# Patient Record
Sex: Male | Born: 1960
Health system: Southern US, Community
[De-identification: ages and names within clinical notes are randomized; demographics above are authoritative.]

## PROBLEM LIST (undated history)

## (undated) DIAGNOSIS — I4819 Other persistent atrial fibrillation: Secondary | ICD-10-CM

## (undated) DIAGNOSIS — I499 Cardiac arrhythmia, unspecified: Secondary | ICD-10-CM

## (undated) DIAGNOSIS — M1990 Unspecified osteoarthritis, unspecified site: Secondary | ICD-10-CM

## (undated) DIAGNOSIS — I10 Essential (primary) hypertension: Secondary | ICD-10-CM

## (undated) HISTORY — DX: Unspecified osteoarthritis, unspecified site: M19.90

## (undated) HISTORY — DX: Other persistent atrial fibrillation: I48.19

## (undated) HISTORY — PX: JOINT REPLACEMENT: SHX530

## (undated) HISTORY — PX: OTHER SURGICAL HISTORY: SHX169

## (undated) HISTORY — PX: CARDIOVERSION: SHX1299

## (undated) HISTORY — PX: APPENDECTOMY: SHX54

---

## 2009-07-07 ENCOUNTER — Encounter: Admission: RE | Admit: 2009-07-07 | Discharge: 2009-07-07 | Payer: Self-pay | Admitting: Neurosurgery

## 2009-07-22 ENCOUNTER — Encounter: Admission: RE | Admit: 2009-07-22 | Discharge: 2009-07-22 | Payer: Self-pay | Admitting: Neurosurgery

## 2009-08-09 ENCOUNTER — Encounter: Admission: RE | Admit: 2009-08-09 | Discharge: 2009-08-09 | Payer: Self-pay | Admitting: Neurosurgery

## 2009-10-08 ENCOUNTER — Encounter: Admission: RE | Admit: 2009-10-08 | Discharge: 2009-10-08 | Payer: Self-pay | Admitting: Neurosurgery

## 2009-12-10 ENCOUNTER — Encounter: Admission: RE | Admit: 2009-12-10 | Discharge: 2009-12-10 | Payer: Self-pay | Admitting: Neurosurgery

## 2010-04-14 ENCOUNTER — Other Ambulatory Visit: Payer: Self-pay | Admitting: Orthopedic Surgery

## 2010-04-14 DIAGNOSIS — M25551 Pain in right hip: Secondary | ICD-10-CM

## 2010-04-15 ENCOUNTER — Ambulatory Visit
Admission: RE | Admit: 2010-04-15 | Discharge: 2010-04-15 | Disposition: A | Discharge status: Home / Self Care | Payer: PRIVATE HEALTH INSURANCE | Source: Ambulatory Visit | Attending: Orthopedic Surgery | Admitting: Orthopedic Surgery

## 2010-04-15 DIAGNOSIS — M25551 Pain in right hip: Secondary | ICD-10-CM

## 2010-05-25 ENCOUNTER — Encounter (HOSPITAL_COMMUNITY): Payer: PRIVATE HEALTH INSURANCE | Attending: Orthopedic Surgery

## 2010-05-25 ENCOUNTER — Other Ambulatory Visit (HOSPITAL_COMMUNITY): Payer: Self-pay

## 2010-05-25 DIAGNOSIS — Z01812 Encounter for preprocedural laboratory examination: Secondary | ICD-10-CM | POA: Insufficient documentation

## 2010-05-25 DIAGNOSIS — Z01810 Encounter for preprocedural cardiovascular examination: Secondary | ICD-10-CM | POA: Insufficient documentation

## 2010-05-25 LAB — DIFFERENTIAL
Basophils Absolute: 0.1 10*3/uL (ref 0.0–0.1)
Basophils Relative: 1 % (ref 0–1)
Eosinophils Absolute: 0.1 10*3/uL (ref 0.0–0.7)
Monocytes Absolute: 0.8 10*3/uL (ref 0.1–1.0)
Monocytes Relative: 8 % (ref 3–12)

## 2010-05-25 LAB — BASIC METABOLIC PANEL
BUN: 22 mg/dL (ref 6–23)
Calcium: 9 mg/dL (ref 8.4–10.5)
Chloride: 103 mEq/L (ref 96–112)
Creatinine, Ser: 0.81 mg/dL (ref 0.4–1.5)

## 2010-05-25 LAB — URINALYSIS, ROUTINE W REFLEX MICROSCOPIC
Ketones, ur: NEGATIVE mg/dL
Nitrite: NEGATIVE
Protein, ur: NEGATIVE mg/dL
Urobilinogen, UA: 1 mg/dL (ref 0.0–1.0)

## 2010-05-25 LAB — CBC
MCH: 31.4 pg (ref 26.0–34.0)
MCHC: 33.5 g/dL (ref 30.0–36.0)
Platelets: 290 10*3/uL (ref 150–400)
RDW: 13.5 % (ref 11.5–15.5)

## 2010-05-25 LAB — APTT: aPTT: 28 seconds (ref 24–37)

## 2010-05-25 LAB — SURGICAL PCR SCREEN: MRSA, PCR: NEGATIVE

## 2010-05-26 ENCOUNTER — Ambulatory Visit (INDEPENDENT_AMBULATORY_CARE_PROVIDER_SITE_OTHER): Payer: PRIVATE HEALTH INSURANCE | Admitting: Cardiology

## 2010-05-26 ENCOUNTER — Encounter: Payer: Self-pay | Admitting: Cardiology

## 2010-05-26 DIAGNOSIS — I4891 Unspecified atrial fibrillation: Secondary | ICD-10-CM

## 2010-05-26 DIAGNOSIS — Z01810 Encounter for preprocedural cardiovascular examination: Secondary | ICD-10-CM

## 2010-05-26 MED ORDER — METOPROLOL SUCCINATE ER 25 MG PO TB24: 25.0000 mg | ORAL_TABLET | Freq: Every day | ORAL | Status: DC

## 2010-05-26 NOTE — Progress Notes (Addendum)
HPI: 50 year old male with no prior cardiac history for evaluation of atrial fibrillation and preoperatively prior to hip replacement. Patient seen yesterday for evaluation prior to hip replacement. Routine electrocardiogram revealed atrial fibrillation. Patient denies dyspnea on exertion, orthopnea, PND, pedal edema, palpitations, syncope or chest pain. Because of his atrial fibrillation we were asked to evaluate. No recent potassium, renal function and hemoglobin normal.  Current Outpatient Prescriptions  Medication Sig Dispense Refill  . naproxen sodium (ANAPROX) 220 MG tablet Take 220 mg by mouth as needed.        . metoprolol succinate (TOPROL XL) 25 MG 24 hr tablet Take 1 tablet (25 mg total) by mouth daily.  30 tablet  11    No Known Allergies  No past medical history on file.  Past Surgical History  Procedure Date  . Left shoulder surgery   . Appendectomy     History   Social History  . Marital Status: Single    Spouse Name: N/A    Number of Children: 2  . Years of Education: N/A   Occupational History  .     Social History Main Topics  . Smoking status: Never Smoker   . Smokeless tobacco: Not on file  . Alcohol Use: Yes     occasional  . Drug Use: No  . Sexually Active: Not on file   Other Topics Concern  . Not on file   Social History Narrative  . No narrative on file    No family history on file.  ROS: no fevers or chills, productive cough, hemoptysis, dysphasia, odynophagia, melena, hematochezia, dysuria, hematuria, rash, seizure activity, orthopnea, PND, pedal edema, claudication. Remaining systems are negative.  Physical Exam: General:  Well developed/well nourished in NAD Skin warm/dry Patient not depressed No peripheral clubbing Back-normal HEENT-normal/normal eyelids Neck supple/normal carotid upstroke bilaterally; no bruits; no JVD; no thyromegaly chest - CTA/ normal expansion CV - RRR/normal S1 and S2; no murmurs, rubs or gallops;  PMI  nondisplaced Abdomen -NT/ND, no HSM, no mass, + bowel sounds, no bruit 2+ femoral pulses, no bruits Ext-no edema, chords, 2+ DP Neuro-grossly nonfocal  ECG 05/25/10 - Atrial fibrillation at a rate of 104. Axis normal. No ST changes.   Electrocardiogram today shows normal sinus rhythm with no ST changes.

## 2010-05-26 NOTE — Patient Instructions (Signed)
Your physician wants you to follow-up in: 3 MONTHS You will receive a reminder letter in the mail two months in advance. If you don't receive a letter, please call our office to schedule the follow-up appointment.   Your physician has requested that you have a lexiscan myoview. For further information please visit [email or web address] Please follow instruction sheet, as given.   Your physician has requested that you have an echocardiogram. Echocardiography is a painless test that uses sound waves to create images of your heart. It provides your doctor with information about the size and shape of your heart and how well your heart's chambers and valves are working. This procedure takes approximately one hour. There are no restrictions for this procedure.   Your physician recommends that you return for lab work in: TODAY  START METOPROLOL SUCC 25 MG ONCE DAILY  START ASPIRIN 325 MG ONCE DAILY AFTER SURGERY

## 2010-05-26 NOTE — Assessment & Plan Note (Signed)
Given atrial fibrillation, will screen for ischemia. Schedule lexiscan myoview.

## 2010-05-26 NOTE — Assessment & Plan Note (Signed)
Patient has new diagnosis of atrial fibrillation. He is not having symptoms and has converted to sinus rhythm on electrocardiogram today. He has no embolic risk factors. Plan add aspirin 325 mg daily following his hip replacement. Check TSH and echocardiogram. Add Toprol 25 mg daily.

## 2010-05-30 NOTE — H&P (Signed)
NAME:  Lance Hunt, Lance Hunt                ACCOUNT NO.:  [id number]  MEDICAL RECORD NO.:  [id number]           PATIENT TYPE:  O  LOCATION:  PADM                         FACILITY:  [facility]  PHYSICIAN:  Madlyn Frankel. Charlann Boxer, M.D.  DATE OF BIRTH:  [birth year]  DATE OF ADMISSION:  05/25/2010 DATE OF DISCHARGE:                             HISTORY & PHYSICAL   DATE OF SURGERY:  May 31, 2010  ADMISSION DIAGNOSIS:  Right hip osteoarthritis.  HISTORY OF PRESENT ILLNESS:  This is a 50 year old gentleman with a history of osteoarthritis of his right hip that has failed conservative treatment to alleviate his symptoms.  After discussion of treatments, benefits, risks, and options, the patient is now scheduled for total hip arthroplasty.  Note that at this time, he is not a candidate for tranexamic acid.  He is given his home medications including aspirin 325 mg b.i.d. for 4 weeks for DVT prophylaxis.  Note that on his H and P today, he did have an irregular heartbeat that he has not then noted to have before.  We will be getting an EKG at his preop today at the hospital and getting him a medical clearance prior to surgery with this. Also note, his blood pressure was mildly elevated at 158/100 today.  PAST MEDICAL HISTORY:  Drug allergies none.  CURRENT MEDICATIONS:  None.  PREVIOUS SURGERY:  Appendectomy and left shoulder surgery.  FAMILY HISTORY:  Positive for diabetes.  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  He drinks socially and does not smoke.  REVIEW OF SYSTEMS:  CENTRAL NERVOUS SYSTEM:  Negative for headache, blurred vision, or dizziness.  PULMONARY: Negative for shortness breath, PND, and orthopnea.  CARDIOVASCULAR:  Negative for chest pain or palpitation.  GI:  Negative for ulcers or hepatitis.  GU:  Negative for urinary tract difficulty.  MUSCULOSKELETAL:  Positive as in HPI.  PHYSICAL EXAMINATION:  VITAL SIGNS:  BP 158/100, respirations 16,  pulse 84 and irregular. GENERAL APPEARANCE:  This is a well-developed, well-nourished gentleman in no acute distress. HEENT:  Head normocephalic.  Nose patent.  Pupils equal, round, and reactive to light.  Throat without injection. NECK:  Supple without adenopathy.  Carotids 2+ without bruit. CHEST:  Clear to auscultation.  No rales or rhonchi.  Respirations 16. HEART:  Irregular rate and rhythm at 84 beats per minute without murmur. ABDOMEN:  Soft with active bowel sounds.  No masses or organomegaly. NEUROLOGIC:  The patient alert and oriented to time, place, and person. Cranial nerves II through XII grossly intact. EXTREMITIES:  The right hip with decreased range of motion with pain. Neurovascular status intact.  IMPRESSION:  Right hip osteoarthritis.  PLAN:  Total hip arthroplasty, right hip.  We will get medical evaluation of his heart and medical clearance preop.  Pending that surgery, we will go ahead and schedule.     Jaquelyn Bitter. Chabon, P.A.   ______________________________ Madlyn Frankel Charlann Boxer, M.D.    SJC/MEDQ  D:  05/25/2010  T:  05/26/2010  Job:  [id number]  Electronically Signed by Jodene Nam P.A. on 05/30/2010 12:40:28 PM  Electronically Signed by Durene Romans M.D. on 05/30/2010 01:02:05 PM

## 2010-05-31 ENCOUNTER — Ambulatory Visit (HOSPITAL_BASED_OUTPATIENT_CLINIC_OR_DEPARTMENT_OTHER): Payer: PRIVATE HEALTH INSURANCE | Admitting: Radiology

## 2010-05-31 ENCOUNTER — Other Ambulatory Visit (HOSPITAL_COMMUNITY): Payer: PRIVATE HEALTH INSURANCE | Admitting: Radiology

## 2010-05-31 ENCOUNTER — Ambulatory Visit (HOSPITAL_COMMUNITY): Payer: PRIVATE HEALTH INSURANCE | Attending: Cardiology | Admitting: Radiology

## 2010-05-31 ENCOUNTER — Ambulatory Visit (HOSPITAL_COMMUNITY)
Admission: RE | Admit: 2010-05-31 | Disposition: A | Discharge status: Home / Self Care | Payer: PRIVATE HEALTH INSURANCE | Source: Ambulatory Visit | Attending: Orthopedic Surgery | Admitting: Orthopedic Surgery

## 2010-05-31 ENCOUNTER — Inpatient Hospital Stay (HOSPITAL_COMMUNITY): Admit: 2010-05-31 | Payer: Self-pay | Admitting: Orthopedic Surgery

## 2010-05-31 DIAGNOSIS — M1610 Unilateral primary osteoarthritis, unspecified hip: Secondary | ICD-10-CM | POA: Insufficient documentation

## 2010-05-31 DIAGNOSIS — R0602 Shortness of breath: Secondary | ICD-10-CM

## 2010-05-31 DIAGNOSIS — Z01810 Encounter for preprocedural cardiovascular examination: Secondary | ICD-10-CM

## 2010-05-31 DIAGNOSIS — I059 Rheumatic mitral valve disease, unspecified: Secondary | ICD-10-CM | POA: Insufficient documentation

## 2010-05-31 DIAGNOSIS — I4949 Other premature depolarization: Secondary | ICD-10-CM

## 2010-05-31 DIAGNOSIS — M169 Osteoarthritis of hip, unspecified: Secondary | ICD-10-CM | POA: Insufficient documentation

## 2010-05-31 DIAGNOSIS — I4891 Unspecified atrial fibrillation: Secondary | ICD-10-CM

## 2010-05-31 MED ORDER — TECHNETIUM TC 99M TETROFOSMIN IV KIT
11.0000 | PACK | Freq: Once | INTRAVENOUS | Status: AC | PRN
  Administered 2010-05-31: 11 via INTRAVENOUS

## 2010-05-31 MED ORDER — REGADENOSON 0.4 MG/5ML IV SOLN
0.4000 mg | Freq: Once | INTRAVENOUS | Status: AC
  Administered 2010-05-31: 0.4 mg via INTRAVENOUS

## 2010-05-31 MED ORDER — TECHNETIUM TC 99M TETROFOSMIN IV KIT
33.0000 | PACK | Freq: Once | INTRAVENOUS | Status: AC | PRN
  Administered 2010-05-31: 33 via INTRAVENOUS

## 2010-05-31 NOTE — Progress Notes (Signed)
[facility] SITE 3 NUCLEAR MED [address] [place] [address] [phone]  Cardiology Nuclear Med Study  Lance Hunt is a 50 y.o. male [id number] [birth year]   Nuclear Med Background Indication for Stress Test:  Evaluation for Ischemia and Surgical Clearance for (R) THR by Dr.M.Olin on 07/26/10 History:  New diagnosis of Atrial fib Cardiac Risk Factors: None  Symptoms:  None   Nuclear Pre-Procedure Caffeine/Decaff Intake:  None NPO After: 7:30pm   Lungs:  clear IV 0.9% NS with Angio Cath:  22g  IV Site: R Hand  IV Started by:  Irean Hong, RN  Chest Size (in):  42 Cup Size: n/a  Height: 6' (1.829 m)  Weight:  200 lb (90.719 kg)  BMI:  Body mass index is 27.12 kg/(m^2). Tech Comments:  Last dose toprol 24 hrs ago    Nuclear Med Study 1 or 2 day study: 1 day  Stress Test Type:  Treadmill/Lexiscan  Reading MD: Charlton Haws, MD  Order Authorizing Provider:  Ripley Fraise  Resting Radionuclide: Technetium 78m Tetrofosmin  Resting Radionuclide Dose: 11 mCi   Stress Radionuclide:  Technetium 42m Tetrofosmin  Stress Radionuclide Dose: 33 mCi           Stress Protocol Rest HR: 64 Stress HR: 127  Rest BP: 125/85 Stress BP: 168/97  Exercise Time (min): 2:00 METS: 1.8   Predicted Max HR: 171 bpm % Max HR: 73.68 bpm Rate Pressure Product: 21308   Dose of Adenosine (mg):  n/a Dose of Lexiscan: 0.4 mg  Dose of Atropine (mg): n/a Dose of Dobutamine: n/a mcg/kg/min (at max HR)  Stress Test Technologist: Cathlyn Parsons, RN  Nuclear Technologist:  Domenic Polite, CNMT     Rest Procedure:  Myocardial perfusion imaging was performed at rest 45 minutes following the intravenous administration of Technetium 11m Tetrofosmin. Rest ECG: NSR with PAC's  Stress Procedure:  The patient received IV Lexiscan 0.4 mg over 15-seconds with concurrent low level exercise and then Technetium 74m Tetrofosmin was injected at 30-seconds while the patient  continued walking one more minute.  There were no significant changes with Lexiscan. Patient had occasional PAC's and rare PVC's. Quantitative spect images were obtained after a 45-minute delay. Stress ECG: No significant change from baseline ECG  QPS Raw Data Images:  Normal; no motion artifact; normal heart/lung ratio. Stress Images:  Normal homogeneous uptake in all areas of the myocardium. Rest Images:  Normal homogeneous uptake in all areas of the myocardium. Subtraction (SDS):  SDS 1 Transient Ischemic Dilatation (Normal <1.22):  1.07 Lung/Heart Ratio (Normal <0.45):  0.43  Quantitative Gated Spect Images QGS EDV:  n/a  QGS ESV:  n/a QGS cine images:  study not gated QGS EF: Study not gated  Impression Exercise Capacity:  Lexiscan with no exercise. BP Response:  Normal blood pressure response. Clinical Symptoms:  There is dyspnea. ECG Impression:  Frequent PAC;s Comparison with Prior Nuclear Study: No images to compare  Overall Impression:  Normal stress nuclear study.  No gating due to frequent PAC;s       Charlton Haws

## 2010-06-01 ENCOUNTER — Encounter: Payer: Self-pay | Admitting: Cardiology

## 2010-06-01 NOTE — Progress Notes (Signed)
Copy routed to Dr. Crenshaw.Falecha Clark ° °

## 2010-06-01 NOTE — Progress Notes (Signed)
Per Dr. Jens Som, stress test normal. He is cleared for his surgery.

## 2010-07-08 ENCOUNTER — Telehealth: Payer: Self-pay | Admitting: Cardiology

## 2010-07-08 NOTE — Telephone Encounter (Signed)
Faxed LOV, EKG, Stress & Echo. To [facility] @ [place] ([phone]).

## 2010-07-10 HISTORY — PX: TOTAL HIP ARTHROPLASTY: SHX124

## 2010-07-20 ENCOUNTER — Other Ambulatory Visit: Payer: Self-pay | Admitting: Orthopedic Surgery

## 2010-07-20 ENCOUNTER — Other Ambulatory Visit (HOSPITAL_COMMUNITY): Payer: Self-pay | Admitting: Orthopedic Surgery

## 2010-07-20 ENCOUNTER — Ambulatory Visit (HOSPITAL_COMMUNITY)
Admission: RE | Admit: 2010-07-20 | Discharge: 2010-07-20 | Disposition: A | Discharge status: Home / Self Care | Payer: PRIVATE HEALTH INSURANCE | Source: Ambulatory Visit | Attending: Orthopedic Surgery | Admitting: Orthopedic Surgery

## 2010-07-20 ENCOUNTER — Encounter (HOSPITAL_COMMUNITY): Payer: PRIVATE HEALTH INSURANCE

## 2010-07-20 DIAGNOSIS — Z01811 Encounter for preprocedural respiratory examination: Secondary | ICD-10-CM

## 2010-07-20 DIAGNOSIS — I10 Essential (primary) hypertension: Secondary | ICD-10-CM | POA: Insufficient documentation

## 2010-07-20 DIAGNOSIS — Z01810 Encounter for preprocedural cardiovascular examination: Secondary | ICD-10-CM | POA: Insufficient documentation

## 2010-07-20 DIAGNOSIS — M4120 Other idiopathic scoliosis, site unspecified: Secondary | ICD-10-CM | POA: Insufficient documentation

## 2010-07-20 DIAGNOSIS — Z01812 Encounter for preprocedural laboratory examination: Secondary | ICD-10-CM | POA: Insufficient documentation

## 2010-07-20 DIAGNOSIS — Z01818 Encounter for other preprocedural examination: Secondary | ICD-10-CM | POA: Insufficient documentation

## 2010-07-20 LAB — CBC
MCH: 32.6 pg (ref 26.0–34.0)
MCHC: 33.5 g/dL (ref 30.0–36.0)
Platelets: 272 10*3/uL (ref 150–400)
RDW: 13.5 % (ref 11.5–15.5)

## 2010-07-20 LAB — BASIC METABOLIC PANEL
BUN: 26 mg/dL — ABNORMAL HIGH (ref 6–23)
CO2: 26 mEq/L (ref 19–32)
Chloride: 102 mEq/L (ref 96–112)
GFR calc Af Amer: >60 mL/min (ref >60)
Glucose, Bld: 99 mg/dL (ref 70–99)
Potassium: 4.2 mEq/L (ref 3.5–5.1)

## 2010-07-20 LAB — DIFFERENTIAL
Basophils Absolute: 0.1 10*3/uL (ref 0.0–0.1)
Basophils Relative: 1 % (ref 0–1)
Eosinophils Absolute: 0.2 10*3/uL (ref 0.0–0.7)
Monocytes Absolute: 1 10*3/uL (ref 0.1–1.0)
Monocytes Relative: 10 % (ref 3–12)
Neutro Abs: 6.1 10*3/uL (ref 1.7–7.7)

## 2010-07-20 LAB — URINALYSIS, ROUTINE W REFLEX MICROSCOPIC
Bilirubin Urine: NEGATIVE
Glucose, UA: NEGATIVE mg/dL
Ketones, ur: NEGATIVE mg/dL
Protein, ur: NEGATIVE mg/dL
Urobilinogen, UA: 1 mg/dL (ref 0.0–1.0)

## 2010-07-20 LAB — PROTIME-INR
INR: 0.93 (ref 0.00–1.49)
Prothrombin Time: 12.7 seconds (ref 11.6–15.2)

## 2010-07-20 LAB — URINE MICROSCOPIC-ADD ON

## 2010-07-20 NOTE — H&P (Unsigned)
NAME:  ESKER, DEVER NO.:  [id number]  MEDICAL RECORD NO.:  [id number]  LOCATION:  1S                           FACILITY:  [facility]  PHYSICIAN:  Madlyn Frankel. Charlann Boxer, M.D.  DATE OF BIRTH:  09-26-1960  DATE OF ADMISSION:  07/04/2010 DATE OF DISCHARGE:                             HISTORY & PHYSICAL   DATE OF SURGERY:  July 26, 2010.  CARDIOLOGIST:  Madolyn Frieze. Jens Som, MD, [facility]  The patient will be going home after surgery.  He is not a candidate for tranexamic acid.  He has been given his home medications of aspirin, Robaxin, iron, Colace, and MiraLax to take after surgery.  CHIEF COMPLAINT:  Right hip osteoarthritis.  HISTORY OF PRESENT ILLNESS:  This is a 50 year old gentleman with a history of osteoarthritis of his right hip that failed conservative treatment.  His last H and P was noted to have an irregular heartbeat, felt to be AFib.  He was sent to the hospital.  EKG did document new- onset AFib.  He went to the cardiologist the next day, he had already converted at that time, was worked up and found no contraindications to proceeding with the surgery, and at this time is to proceed with his right total hip arthroplasty by anterior approach.  The surgeries, benefits and aftercare were discussed in detail with the patient. Questions invited and answered.  Surgery to go ahead as scheduled.  PAST MEDICAL HISTORY:  Drug allergies, none.  CURRENT MEDICATIONS:  Metoprolol ER 25 mg once a day.  PAST SURGERIES:  Appendectomy and left shoulder surgery.  SERIOUS MEDICAL ILLNESSES: 1. History of AFib. 2,  Hypertension.  FAMILY HISTORY:  Positive for diabetes.  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  He drinks socially and does not smoke.  REVIEW OF SYSTEMS:  CENTRAL NERVOUS SYSTEM:  Negative for headache, blurred vision, or dizziness.  PULMONARY:  No shortness of breath, PND, or orthopnea.  CARDIOVASCULAR:  Negative for  chest pain or palpitation. Positive for history of AFib, currently in normal sinus rhythm.  GI: Negative for ulcers, hepatitis.  GU:  Negative for urinary tract difficulty.  MUSCULOSKELETAL:  Positive in HPI.  PHYSICAL EXAMINATION:  VITAL SIGNS:  BP 158/84, respirations 16, pulse 68 and regular. GENERAL APPEARANCE:  This is a well-developed, well-nourished gentleman in no acute distress. HEENT:  Head normocephalic.  Nose patent.  Ears patent.  Pupils equal, round and reactive to light.  Throat without injection. NECK:  Supple without adenopathy.  Carotids 2+ without bruit. CHEST:  Clear to auscultation.  No rales or rhonchi.  Respirations 16, pulse 68 and regular. HEART:  Regular rate and rhythm at 68 beats per minute without murmur. ABDOMEN:  Soft with active bowel sounds.  No masses or organomegaly. NEUROLOGIC:  The patient alert and oriented to time, place and person. Cranial nerves II through XII grossly intact. EXTREMITIES:  The right hip with about 5 degrees flexion contracture, further flexion at 90 with pain, 30 degrees arc of motion with pain. Neurovascular status intact.  IMPRESSION:  Right hip osteoarthritis.  PLAN:  Right total hip replacement by anterior approach.  Jaquelyn Bitter. Courtnee Myer, P.A.   ______________________________ Madlyn Frankel Charlann Boxer, M.D.    SJC/MEDQ  D:  07/20/2010  T:  07/20/2010  Job:  [id number]

## 2010-07-26 ENCOUNTER — Inpatient Hospital Stay (HOSPITAL_COMMUNITY)
Admission: RE | Admit: 2010-07-26 | Discharge: 2010-07-28 | DRG: 470 | Disposition: A | Discharge status: Home Health | Payer: PRIVATE HEALTH INSURANCE | Source: Ambulatory Visit | Attending: Orthopedic Surgery | Admitting: Orthopedic Surgery

## 2010-07-26 ENCOUNTER — Inpatient Hospital Stay (HOSPITAL_COMMUNITY): Payer: PRIVATE HEALTH INSURANCE

## 2010-07-26 DIAGNOSIS — M169 Osteoarthritis of hip, unspecified: Principal | ICD-10-CM | POA: Diagnosis present

## 2010-07-26 DIAGNOSIS — Z01812 Encounter for preprocedural laboratory examination: Secondary | ICD-10-CM

## 2010-07-26 DIAGNOSIS — M1610 Unilateral primary osteoarthritis, unspecified hip: Principal | ICD-10-CM | POA: Diagnosis present

## 2010-07-26 DIAGNOSIS — I4891 Unspecified atrial fibrillation: Secondary | ICD-10-CM

## 2010-07-26 LAB — TYPE AND SCREEN
ABO/RH(D): AB POS
Antibody Screen: NEGATIVE

## 2010-07-27 LAB — BASIC METABOLIC PANEL
Calcium: 8.6 mg/dL (ref 8.4–10.5)
Chloride: 105 mEq/L (ref 96–112)
Creatinine, Ser: 0.66 mg/dL (ref 0.50–1.35)
GFR calc Af Amer: >60 mL/min (ref >60)
Sodium: 137 mEq/L (ref 135–145)

## 2010-07-27 LAB — CBC
MCV: 96.7 fL (ref 78.0–100.0)
Platelets: 261 10*3/uL (ref 150–400)
RDW: 13.1 % (ref 11.5–15.5)
WBC: 8.7 10*3/uL (ref 4.0–10.5)

## 2010-07-28 LAB — CBC
Platelets: 260 10*3/uL (ref 150–400)
RDW: 13 % (ref 11.5–15.5)
WBC: 10.2 10*3/uL (ref 4.0–10.5)

## 2010-07-28 LAB — BASIC METABOLIC PANEL
Chloride: 103 mEq/L (ref 96–112)
GFR calc Af Amer: >60 mL/min (ref >60)
Potassium: 3.9 mEq/L (ref 3.5–5.1)

## 2010-08-01 NOTE — Op Note (Signed)
NAMEMarland Kitchen  Lance Hunt, Lance Hunt NO.:  [id number]  MEDICAL RECORD NO.:  [id number]  LOCATION:  1434                         FACILITY:  [facility]  PHYSICIAN:  Madlyn Frankel. Charlann Boxer, M.D.  DATE OF BIRTH:  07/26/1960  DATE OF PROCEDURE:  07/26/2010 DATE OF DISCHARGE:                              OPERATIVE REPORT   PREOPERATIVE DIAGNOSIS:  Right hip osteoarthritis.  POSTOPERATIVE DIAGNOSIS:  Right hip osteoarthritis.  FINDINGS:  The patient has severe right hip osteoarthritis with significant scarring to the anterior capsular tissues including the anterior musculature in this area.  PROCEDURE:  Right total hip replacement utilizing a DePuy component with a size 54 pinnacle cup, 36 +4 neutral AltrX liner and 8 high Tri-Lock stem with 36 +1.5 Delta ceramic ball.  SURGEON:  Madlyn Frankel. Charlann Boxer, MD  ASSISTANT:  Jaquelyn Bitter. Chabon, P.A.  ANESTHESIA:  General.  BLOOD LOSS:  Approximately 600 cc.  DRAINS:  One Hemovac.  COMPLICATIONS:  None.  INDICATIONS FOR PROCEDURE:  Lance Hunt is a 50 year old male who presented to the office for evaluation of right, greater than left hip pain.  Radiographs revealed end-stage right hip osteoarthritis with cystic changes with slightly less severe changes to the left hip.  After reviewing risks and benefits and obtaining medical and cardiac clearance, the surgery was scheduled for a right total hip replacement. We discussed the risks of infection, DVT, component failure, need for revision surgery, the pros and cons of an anterior versus posterior approach and consent was obtained for benefit of pain relief.  PROCEDURE IN DETAIL:  The patient was brought to operative theater. Once adequate anesthesia, preoperative antibiotics administered, the patient was positioned supine on the OSI Hanna table with bony prominence padded.  The right arm tucked across the body.  The right hip was then pre-prepped.  Radiographs under fluoroscopic imaging  were obtained confirming location of the pelvis and landmarks.  The right hip was then prepped and draped in sterile fashion.  Timeout was performed identifying the patient, planned procedure and the extremity.  An incision was made from 2 cm inferior and lateral to the anterior- superior iliac spine over the anterior aspect of the trochanter.  Sharp dissection was carried down to the fascia of the tensor fascia lata muscle.  Muscle plane was created and muscle swept laterally and retractor placed along the superior neck.  The circumflex vessels were then identified and cauterized.  Pericapsular fat removed and retractor placed inferiorly.  Following further exposure of hip, we identified significant anterior scarring related to the arthritis.  I feel the capsule was very adherent to the anterior capsule as was the musculature anteriorly.  A capsulotomy was made but I ended up removing the inferior leaflets based on the adherence of the capsule down to the head and neck.  Fluoroscopy was used to identify location for a neck cut.  The neck osteotomy was then made and femoral head removed and noted severe arthritis of his right hip.  Traction was taken off the femur and attention was now directed to acetabulum.  Acetabular retractors were positioned.  Labrum and soft tissues removed.  I began reaming with a 44 reamer and reamed up  to 53 reamer with good bony bed preparation.  I removed posterior osteophyte, anterior osteophyte.  The 54 pinnacle cup was then impacted and positioned with about 35 degrees of abduction radiographically and then 15 to 20 degrees of anteversion.  This was confirmed radiographically. Based on the position of the cup and intraoperative by palpation and radiographically, a single cancellous screw was placed.  A 36 +4 neutral final AltrX liner was impacted.  At this point, the femur was rotated to 100 degrees.  The capsule was removed off the inferior neck.   Retractor placed inferiorly along the medial calcar and the femur elevated with a hook and held in place with a table hook.  The hip was then extended and abducted and proximal femur opened the broach.  With the starting, a box osteotome, I than began using a starting broach and broached up to a size initially to 1.  I confirmed the location of the stem in AP and lateral planes with radiographs without retractors. The retractors were placed and I ended up broaching up to a size 8.  A trial reduction was carried out with initially an 8 standard but there also did not appear to be appropriate fairly contralateral side.  With the 8 high offset neck and 36 plus 1.5 ball, I did feel that I was lengthening a bit but I was okay with this based on the stability of the hip but also based on the fact that he had contralateral hip arthritis and we could match this within a year or so.  Given these findings, a final 8 high Tri-Lock stem was chosen and it was impacted and sat where the previous broach had been placed.  The 36 +1.5 Delta ceramic ball was chosen based on his age to use as a ceramic on polyethylene insert.  The hip had been irrigated throughout the case again at this point. There was no capsular repair but I did restore the superior leaflet back to its original location.  A medium Hemovac drain was placed deep.  The fascia of the tensor fascia muscle was then reapproximated over top of this.  The remainder of the wound was closed with 2-0 Vicryl and running 4-0 Monocryl.  The hip was cleaned, dried, and dressed sterilely using Dermabond and Aquacel dressing.  Drain site dressed separately.  He was then brought to recovery room in stable condition, extubated, tolerating the procedure well.     Madlyn Frankel Charlann Boxer, M.D.     MDO/MEDQ  D:  07/26/2010  T:  07/26/2010  Job:  [id number]  Electronically Signed by Durene Romans M.D. on 08/01/2010 10:14:23 AM

## 2010-08-01 NOTE — Discharge Summary (Signed)
NAMEMarland Kitchen  Lance, Hunt NO.:  [id number]  MEDICAL RECORD NO.:  [id number]  LOCATION:  1434                         FACILITY:  [facility]  PHYSICIAN:  Madlyn Frankel. Charlann Boxer, M.D.  DATE OF BIRTH:  May 09, 1960  DATE OF ADMISSION:  07/26/2010 DATE OF DISCHARGE:  07/28/2010                              DISCHARGE SUMMARY   ADMITTING DIAGNOSIS:  Right hip osteoarthritis.  DISCHARGE DIAGNOSES: 1. Right hip osteoarthritis. 2. History of new-onset atrial fibrillation.  ADMITTING HISTORY:  The patient is a 50 year old male with advanced bilateral hip osteoarthritis, right greater left.  He presented for evaluation, was actually scheduled for surgery back in May of 2012.  At the time of that surgery, he had new-onset atrial fibrillation and anesthesia cancelled the surgery.  After being seen and evaluated by Dr. [name] with [facility] he was set up for surgery again this July.  Risks and benefits have been reviewed and discussed. Consent was obtained for benefit of pain relief.  HOSPITAL COURSE:  The patient admitted for same-day surgery on July 16, 2010.  At that time, he underwent a right total hip replacement.  Please see dictated operative note for full details of the procedure.  At the time of induction, they notice he was back into atrial fibrillation.  We subsequently reconsulted Cardiology for evaluation.  They saw him on that day of surgery and felt that at the point of their evaluation he was in normal sinus rhythm with some PACs.  On postop day #1, he was stable with no events overnight.  His Foley catheter and Hemovac drain were removed on day #1.  On postop day #1, he had hematocrit of 35.2 and on day #2 his hematocrit was 38.  His thigh remained relatively soft without significant bruising.  He was neurovascular intact.  On day #2, he was seen and evaluated in the hospital chair with hip flexed obviously indicating good pain relief in certain  indifferent postures,  He was seen and evaluated by Physical Therapy and progressed nicely using a walker with weightbearing as tolerated.  DISCHARGE CONDITION:  Stable  DISCHARGE INSTRUCTIONS:  The patient can be discharged home with Home Health Physical Therapy, transitioning from a walker to cane.  He will return to see Dr. Durene Romans at [facility] at [phone] in 2 weeks' time and any orthopedic questions can be addressed there.  Any concerns about heart rate should be addressed by Dr. [name].  He does know to arrange followup with them in the next 2 to 3 weeks to make certain he is not going in atrial fibrillation or any other issues there.  He can continue to wear his Aquacel dressing for 8 days.  He will remove this and then place gauze and tape.  DISCHARGE MEDICATIONS:  Include: 1. Colace 100 mg p.o. b.i.d. as needed for constipation while on pain     medicine. 2. MiraLax 17 g p.o. once or twice a day as needed for constipation     while on pain medicine. 3. Aspirin 325 mg p.o. b.i.d. for 30 days. 4. Norco 7.5/325 one to two tablets every 4 to 6 as needed for pain. 5.  Robaxin 500 mg p.o. q6 h as needed for pain. 6. Metoprolol 25 mg p.o. q.a.m. 7. Advil or Aleve as needed.  Questions encouraged and reviewed at the time of discharge.     Madlyn Frankel Charlann Boxer, M.D.     MDO/MEDQ  D:  07/28/2010  T:  07/28/2010  Job:  [id number]  Electronically Signed by Durene Romans M.D. on 08/01/2010 10:14:27 AM

## 2010-08-05 NOTE — Consult Note (Signed)
NAMEMarland Hunt  CRIT, OBREMSKI NO.:  [id number]  MEDICAL RECORD NO.:  [id number]  LOCATION:  1434                         FACILITY:  [facility]  PHYSICIAN:  Rollene Rotunda, MD, FACCDATE OF BIRTH:  Mar 03, 1960  DATE OF CONSULTATION:  07/26/2010 DATE OF DISCHARGE:                                CONSULTATION   PRIMARY CARE PHYSICIAN:  None.  CARDIOLOGIST:  Madolyn Frieze. Jens Som, MD, [facility].  REFERRING PHYSICIAN:  Madlyn Frankel. Charlann Boxer, M.D.  REASON FOR CONSULTATION:  The patient with atrial fibrillation.  HISTORY OF PRESENT ILLNESS:  The patient is pleasant 50 year old white gentleman who had atrial fibrillation noted previously when orthopedic surgery was attempted.  Apparently this procedure was cancelled.  He was sent to see Dr. Jens Som.  He has never noticed atrial fibrillation.  He has had a few palpitations that he has noticed recently because he thinks he is paying attention.  He otherwise has had no prior cardiac history.  He has never had any presyncope or syncope.  He has never had any chest discomfort, neck or arm discomfort.  He has no shortness of breath, PND or orthopnea.  He did have a preop workup which included a stress perfusion study which was negative for any evidence of ischemia. Echocardiogram demonstrated no significant abnormalities.  Today, he did have right hip replacement.  He apparently went into atrial fibrillation postoperatively or in the operating room.  I do not see any strips or an EKG today.  He subsequently converted to sinus rhythm.  I do see some rhythm strips which demonstrated sinus with PACs.  On a monitor, he was felt to have heart rate of 40, although this may have been undercounting his PVCs.  He does not recall any of these events.  He is currently in sinus rhythm and hemodynamically stable.  PAST MEDICAL HISTORY:  Atrial fibrillation.  PAST SURGICAL HISTORY:  Appendectomy, left shoulder surgery.  ALLERGIES:  None.  MEDICATIONS:   Metoprolol 25 mg daily, Aleve, Advil.  SOCIAL HISTORY:  The patient is married.  He has two children.  He works in [employer].  He is quite active.  FAMILY HISTORY:  Noncontributory for early coronary artery disease.  His mother does have diabetes.  REVIEW OF SYSTEMS:  As stated in the HPI and negative for all other systems.  PHYSICAL EXAMINATION:  GENERAL:  The patient is in no distress. VITAL SIGNS:  Blood pressure 132/68, heart rate 70 and regular, afebrile, respiratory rate 16. HEENT:  Eyes unremarkable.  Pupils equal, round and reactive to light. Fundi not visualized. NECK:  No jugular venous distention, carotid upstroke brisk and symmetrical.  No bruits, no thyromegaly. LYMPHATICS:  No cervical, axillary or inguinal adenopathy. LUNGS:  Clear to auscultation bilaterally. BACK:  No costovertebral angle tenderness. CHEST:  Unremarkable. HEART:  PMI not displaced or sustained.  S1, S2 within normal limits. No S3, no S4, no clicks, no rubs, no murmurs. ABDOMEN:  Flat, positive bowel sounds, normal frequency and pitch.  No bruits, rebound or guarding.  No midline pulsatile mass.  No hepatomegaly, no splenomegaly. SKIN:  No rashes, no nodules. EXTREMITIES:  A 2+ pulses throughout.  No edema, no cyanosis or clubbing.  NEURO:  Oriented to person, place and time.  Cranial nerves II through XII grossly intact, motor grossly intact.  LABORATORY DATA:  EKG, none.  Labs, none.  ASSESSMENT/PLAN:  Atrial fibrillation.  The patient did have paroxysmal atrial fibrillation apparently.  He is back in sinus rhythm.  He is not having any symptoms.  I do not suspect that he will have any significant issues with this but it was reasonable to follow him on telemetry.  He should restart his beta-blocker.  There is no indication for anticoagulation based on this issue other than an aspirin which can be started when it is felt to be safe from a surgical standpoint.  No further inpatient cardiac  workup will be suggested if there are no further dysrhythmias.     Rollene Rotunda, MD, [facility]     JH/MEDQ  D:  07/26/2010  T:  07/26/2010  Job:  [id number]  Electronically Signed by Rollene Rotunda MD [facility] on 08/05/2010 07:50:08 PM

## 2010-08-23 ENCOUNTER — Ambulatory Visit: Payer: PRIVATE HEALTH INSURANCE | Admitting: Cardiology

## 2011-12-15 ENCOUNTER — Telehealth: Payer: Self-pay | Admitting: Cardiology

## 2011-12-15 DIAGNOSIS — I4891 Unspecified atrial fibrillation: Secondary | ICD-10-CM

## 2011-12-15 NOTE — Telephone Encounter (Signed)
New Problem:    Patient called in needing a refill of his metoprolol succinate (TOPROL XL) 25 MG 24 hr tablet [facility] [address] phone#- [phone]

## 2011-12-17 ENCOUNTER — Encounter (HOSPITAL_COMMUNITY): Payer: Self-pay | Admitting: *Deleted

## 2011-12-17 ENCOUNTER — Emergency Department (HOSPITAL_COMMUNITY)
Admission: EM | Admit: 2011-12-17 | Discharge: 2011-12-17 | Disposition: A | Discharge status: Home / Self Care | Payer: PRIVATE HEALTH INSURANCE | Attending: Emergency Medicine | Admitting: Emergency Medicine

## 2011-12-17 ENCOUNTER — Emergency Department (HOSPITAL_COMMUNITY): Payer: PRIVATE HEALTH INSURANCE

## 2011-12-17 DIAGNOSIS — Z7982 Long term (current) use of aspirin: Secondary | ICD-10-CM | POA: Insufficient documentation

## 2011-12-17 DIAGNOSIS — R55 Syncope and collapse: Secondary | ICD-10-CM | POA: Insufficient documentation

## 2011-12-17 DIAGNOSIS — Z79899 Other long term (current) drug therapy: Secondary | ICD-10-CM | POA: Insufficient documentation

## 2011-12-17 DIAGNOSIS — I10 Essential (primary) hypertension: Secondary | ICD-10-CM | POA: Insufficient documentation

## 2011-12-17 DIAGNOSIS — R002 Palpitations: Secondary | ICD-10-CM | POA: Insufficient documentation

## 2011-12-17 DIAGNOSIS — I4891 Unspecified atrial fibrillation: Secondary | ICD-10-CM | POA: Insufficient documentation

## 2011-12-17 HISTORY — DX: Essential (primary) hypertension: I10

## 2011-12-17 LAB — CBC
HCT: 46.9 % (ref 39.0–52.0)
Hemoglobin: 16.1 g/dL (ref 13.0–17.0)
RBC: 4.99 MIL/uL (ref 4.22–5.81)
WBC: 8.6 10*3/uL (ref 4.0–10.5)

## 2011-12-17 LAB — BASIC METABOLIC PANEL
BUN: 21 mg/dL (ref 6–23)
Chloride: 106 mEq/L (ref 96–112)
GFR calc Af Amer: >90 mL/min (ref >90)
Glucose, Bld: 116 mg/dL — ABNORMAL HIGH (ref 70–99)
Potassium: 3.8 mEq/L (ref 3.5–5.1)

## 2011-12-17 MED ORDER — METOPROLOL TARTRATE 25 MG PO TABS
25.0000 mg | ORAL_TABLET | Freq: Once | ORAL | Status: AC
  Administered 2011-12-17: 25 mg via ORAL
  Filled 2011-12-17: qty 1

## 2011-12-17 MED ORDER — SODIUM CHLORIDE 0.9 % IV BOLUS (SEPSIS)
1000.0000 mL | Freq: Once | INTRAVENOUS | Status: AC
  Administered 2011-12-17: 1000 mL via INTRAVENOUS

## 2011-12-17 MED ORDER — ASPIRIN 81 MG PO CHEW: 81.0000 mg | CHEWABLE_TABLET | Freq: Every day | ORAL | Status: DC

## 2011-12-17 NOTE — ED Provider Notes (Signed)
History     CSN: [id number]  Arrival date & time 12/17/11  1151   First MD Initiated Contact with Patient 12/17/11 1505      Chief Complaint  Patient presents with  . Dizziness    (Consider location/radiation/quality/duration/timing/severity/associated sxs/prior treatment) HPI Comments: Pt comes in with cc of dizziness. He has hx of HTN, Afib. He reports that he was dx with Afib prior to his hip surgery, and was started on metoprolol. Post surgery, he stopped taking his metoprolol and was doing well until last week. Since then, he has  Had couple of episodes of palpitations and felt dizzy, and like he was going to pass out. He had 2 episodes of that nature today, so he came to the ER for further evaluation. No chest pain, sob, nausea, diaphoresis - and the sx are not necessarily related to exertion.   The history is provided by the patient.    Past Medical History  Diagnosis Date  . Hypertension   . A-fib     Past Surgical History  Procedure Date  . Left shoulder surgery   . Appendectomy   . Joint replacement   . Total hip arthroplasty     History reviewed. No pertinent family history.  History  Substance Use Topics  . Smoking status: Never Smoker   . Smokeless tobacco: Not on file  . Alcohol Use: Yes     Comment: occasional      Review of Systems  Constitutional: Negative for fever, chills, activity change and fatigue.  HENT: Negative for neck pain.   Eyes: Negative for visual disturbance.  Respiratory: Negative for cough, chest tightness and shortness of breath.   Cardiovascular: Positive for palpitations. Negative for chest pain.  Gastrointestinal: Negative for abdominal distention.  Genitourinary: Negative for dysuria, enuresis and difficulty urinating.  Musculoskeletal: Negative for arthralgias.  Neurological: Positive for dizziness and light-headedness. Negative for syncope and headaches.  Psychiatric/Behavioral: Negative for confusion.    Allergies   Review of patient's allergies indicates no known allergies.  Home Medications   Current Outpatient Rx  Name  Route  Sig  Dispense  Refill  . ASPIRIN 325 MG PO TABS   Oral   Take 1 tablet (325 mg total) by mouth daily.   100 tablet   3   . METOPROLOL SUCCINATE ER 25 MG PO TB24   Oral   Take 1 tablet (25 mg total) by mouth daily.   30 tablet   11   . NAPROXEN SODIUM 220 MG PO TABS   Oral   Take 220 mg by mouth as needed.             BP 129/85  Pulse 79  Temp 98 F (36.7 C) (Oral)  Resp 12  SpO2 100%  Physical Exam  Nursing note and vitals reviewed. Constitutional: He is oriented to person, place, and time. He appears well-developed.  HENT:  Head: Normocephalic and atraumatic.  Eyes: Conjunctivae normal and EOM are normal. Pupils are equal, round, and reactive to light.  Neck: Normal range of motion. Neck supple. No JVD present.  Cardiovascular: Normal heart sounds.        Irregularly irregular with elevated HR  Pulmonary/Chest: Effort normal and breath sounds normal. No respiratory distress. He has no wheezes.  Abdominal: Soft. Bowel sounds are normal. He exhibits no distension. There is no tenderness. There is no rebound and no guarding.  Neurological: He is alert and oriented to person, place, and time.  Skin: Skin is  warm.    ED Course  Procedures (including critical care time)  Labs Reviewed  BASIC METABOLIC PANEL - Abnormal; Notable for the following:    Glucose, Bld 116 (*)     All other components within normal limits  CBC  POCT I-STAT TROPONIN I   Dg Chest Port 1 View  12/17/2011  *RADIOLOGY REPORT*  Clinical Data: Dizziness  PORTABLE CHEST - 1 VIEW  Comparison: 07/20/2010  Findings: Borderline cardiomegaly noted.  No acute infiltrate or pleural effusion.  No pulmonary edema.  IMPRESSION: Borderline cardiomegaly.  No active disease.   Original Report Authenticated By: Natasha Mead, M.D.      No diagnosis found.    MDM   Date: 12/17/2011  Rate:  117  Rhythm: atrial fibrillation  QRS Axis: normal  Intervals: normal  ST/T Wave abnormalities: normal  Conduction Disutrbances:none  Narrative Interpretation:   Old EKG Reviewed: unchanged  Pt comes in with cc of near syncope. He has hx of afib, maintains that he has been non compliant. Pt is noted to be in afib with rvr. He is supposed to be on metoprolol for his afib. Pt also admits to not following up with Dr. Jens Som, Cardiology post hip surgery.  At this time i think his near syncope is due to his uncontrolled afib. He is hemodynamically stable. We will restart patient on metoprolol. His SF syncope score is 0.  Patient will follow up with Cards, he has already refilled his metoprolol prescriptions starting yday.    5:18 PM HR now in the 80s, but it does increase to 110 when he get excited or is talking to me. He feels normal, the 1st ekg showed the known afib only, and patient has a Cariologist that he can follow up with  - so i will discharge at this time.   Derwood Kaplan, MD 12/17/11 1719

## 2011-12-17 NOTE — ED Notes (Signed)
No distress noted.  Denies chest pain or pressure.

## 2011-12-17 NOTE — ED Notes (Signed)
Pt reports intermittent dizziness since thurs. Hx of afib, intermittent, started back on his metoprolol yesterday. Had onset of dizziness again 1 hour ago and [facility], had ekg done and bp checked, afib on monitor and bp 180/122 per ems.denies any cp. ekg done at triage. No acute distress noted, airway intact.

## 2011-12-18 ENCOUNTER — Telehealth: Payer: Self-pay | Admitting: Cardiology

## 2011-12-18 MED ORDER — METOPROLOL SUCCINATE ER 25 MG PO TB24: 25.0000 mg | ORAL_TABLET | Freq: Every day | ORAL | Status: DC

## 2011-12-18 NOTE — Telephone Encounter (Signed)
New problem:   H/O hip surgery. Been off metoprolol 25 mg.  8-10 months. Went to er on yesterday morning . Went to [employer] they check his B/p 180/120 . Advise to go to er.  Took a pill on sat / sun am. Please advise.

## 2011-12-18 NOTE — Telephone Encounter (Signed)
Pt. states he was feeling ok so he stopped taking Metoprolol about 8 months ago and did not come to 3 month f/u appt. with Dr. Jens Som. 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 ER recommended that pt. see his cardiologist as soon as he could. Appt. scheduled with Norma Fredrickson, NP for 12/19/11 on day that Dr. Jens Som is in office, pt. Aware.

## 2011-12-19 ENCOUNTER — Encounter: Payer: Self-pay | Admitting: Nurse Practitioner

## 2011-12-19 ENCOUNTER — Ambulatory Visit (INDEPENDENT_AMBULATORY_CARE_PROVIDER_SITE_OTHER): Payer: PRIVATE HEALTH INSURANCE | Admitting: Nurse Practitioner

## 2011-12-19 VITALS — BP 138/100 | HR 60 | Ht 72.0 in | Wt 225.0 lb

## 2011-12-19 DIAGNOSIS — I4891 Unspecified atrial fibrillation: Secondary | ICD-10-CM

## 2011-12-19 MED ORDER — METOPROLOL SUCCINATE ER 50 MG PO TB24: 50.0000 mg | ORAL_TABLET | Freq: Every day | ORAL | Status: DC

## 2011-12-19 MED ORDER — RIVAROXABAN 20 MG PO TABS: 20.0000 mg | ORAL_TABLET | Freq: Every day | ORAL | Status: DC

## 2011-12-19 NOTE — Patient Instructions (Addendum)
You are still in atrial fibrillation  We are increasing your Toprol to 50 mg a day  We are starting Xarelto 20 mg a day - take with dinner (largest meal of the day)  No NSAID's. Use tylenol instead.  We are going to update your ultrasound  I will see you in about 3 weeks on a day that Dr. Jens Som is here

## 2011-12-19 NOTE — Progress Notes (Signed)
Lance Hunt Date of Birth: 12-26-1960 Medical Record #[id number]  History of Present Illness: [name] is seen today for a work in visit. He is seen for Dr. Jens Som. He has not been seen here since May of 2012. At that time he had had an episode of PAF - converted to sinus. Had an echo and Lexiscan. Has LAE with normal EF last year. Was given Metoprolol. He proceeded on with his hip surgery and did well.  He comes in today. He notes that he stopped his Metoprolol after a couple of months and has been off of it for 7 to 8 months. He notes that looking back, he has had some times where he did "not feel just right". Over this past weekend, he started having dizzy spells at work. 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 BP was markedly elevated at 180/120. He was referred to the ED. There he was noted to be in atrial fib. He had a few pills of his Metoprolol and he started himself back on it. BP still a little elevated. He has no awareness of the atrial fib. Duration unknown. Denies any palpitations, dyspnea, or chest pain. Has felt dizzy. No syncope. He says he is ready to get back on track with taking care of himself.   Current Outpatient Prescriptions on File Prior to Visit  Medication Sig Dispense Refill  . aspirin 81 MG chewable tablet Chew 1 tablet (81 mg total) by mouth daily.  30 tablet  0  . Rivaroxaban (XARELTO) 20 MG TABS Take 1 tablet (20 mg total) by mouth daily.  30 tablet  6    No Known Allergies  Past Medical History  Diagnosis Date  . Hypertension   . A-fib   . OA (osteoarthritis)     Past Surgical History  Procedure Date  . Left shoulder surgery   . Appendectomy   . Joint replacement   . Total hip arthroplasty     History  Smoking status  . Never Smoker   Smokeless tobacco  . Not on file    History  Alcohol Use  . 0.6 oz/week  . 1 Cans of beer per week    Comment: daily    Family History  Problem Relation Age of Onset  . Heart disease Mother     Review  of Systems: The review of systems is per the HPI.  All other systems were reviewed and are negative.  Physical Exam: BP 138/100  Pulse 60  Ht 6' (1.829 m)  Wt 225 lb (102.059 kg)  BMI 30.52 kg/m2 Patient is very pleasant and in no acute distress. Skin is warm and dry. Color is normal.  HEENT is unremarkable. Normocephalic/atraumatic. PERRL. Sclera are nonicteric. Neck is supple. No masses. No JVD. Lungs are clear. Cardiac exam shows an irregular rhythm. Rate is moderately controlled. Abdomen is soft. Extremities are without edema. Gait and ROM are intact. No gross neurologic deficits noted.  LABORATORY DATA:  Echo Study Conclusions May 2012  - Left ventricle: The cavity size was normal. Systolic function was normal. The estimated ejection fraction was in the range of 55% to 60%. Wall motion was normal; there were no regional wall motion abnormalities. - Left atrium: The atrium was mildly dilated. - Atrial septum: No defect or patent foramen ovale was identified.  Myoview Impression from May 2012  Exercise Capacity: Lexiscan with no exercise.  BP Response: Normal blood pressure response.  Clinical Symptoms: There is dyspnea.  ECG Impression: Frequent PAC;s  Comparison with Prior Nuclear Study: No images to compare   Normal stress nuclear study. No gating due to frequent PAC;s  Lab Results  Component Value Date   WBC 8.6 12/17/2011   HGB 16.1 12/17/2011   HCT 46.9 12/17/2011   PLT 282 12/17/2011   GLUCOSE 116* 12/17/2011   NA 140 12/17/2011   K 3.8 12/17/2011   CL 106 12/17/2011   CREATININE 0.77 12/17/2011   BUN 21 12/17/2011   CO2 21 12/17/2011   TSH 1.90 05/26/2010   INR 0.93 07/20/2010   No results found for this basename: CKTOTAL,  CKMB,  CKMBINDEX,  TROPONINI   Dg Chest Port 1 View  12/17/2011  IMPRESSION: Borderline cardiomegaly.  No active disease.   Original Report Authenticated By: Natasha Mead, M.D.    Assessment / Plan:  1. Recurrent atrial fib. Repeat EKG today still  shows atrial fib. He is somewhat symptomatic. Rate is 95 on EKG. I have increased his Metoprolol to 50 mg a day. Recheck his echo. He had LAE on his last study.CXR with CM noted.  Will start Xarelto 20 mg a day with his largest meal of the day. I will see him back in about 3 weeks on a day that Dr. Jens Som is in the office. If he remains in atrial fib, we will proceed on with cardioversion. He may be a candidate for ablation if he fails cardioversion. He will check his pulse as an outpatient.  2. HTN - Metoprolol is increased. He may need additional medicine. I have asked him to monitor at home.   Samples of Xarelto are given today. Metoprolol is increased. I will see him in 3 weeks.   Patient is agreeable to this plan and will call if any problems develop in the interim.

## 2011-12-29 ENCOUNTER — Ambulatory Visit (HOSPITAL_COMMUNITY): Payer: PRIVATE HEALTH INSURANCE | Attending: Cardiology | Admitting: Radiology

## 2011-12-29 DIAGNOSIS — I4891 Unspecified atrial fibrillation: Secondary | ICD-10-CM | POA: Insufficient documentation

## 2011-12-29 DIAGNOSIS — R42 Dizziness and giddiness: Secondary | ICD-10-CM | POA: Insufficient documentation

## 2011-12-29 DIAGNOSIS — I10 Essential (primary) hypertension: Secondary | ICD-10-CM | POA: Insufficient documentation

## 2011-12-29 DIAGNOSIS — I517 Cardiomegaly: Secondary | ICD-10-CM | POA: Insufficient documentation

## 2011-12-29 NOTE — Progress Notes (Signed)
Echocardiogram performed.  

## 2012-01-11 ENCOUNTER — Telehealth: Payer: Self-pay | Admitting: Cardiology

## 2012-01-11 NOTE — Telephone Encounter (Signed)
nm pt was to see Lawson Fiscal tomorrow, but Lawson Fiscal has called out.  Pt is having some vague chest pain, also some in neck and wanted to see someone regarding this as soon as possible.  Please call pt back regarding when he can be seen, if at all possible tomorrow.

## 2012-01-11 NOTE — Telephone Encounter (Signed)
Pt given appt tomorrow with dr Jens Som

## 2012-01-11 NOTE — Telephone Encounter (Signed)
F/U   C/o pain, wants to be seen tomorrow . Spoke with Debra appt to be on 1/3 @ 9:45 . Patient is aware.

## 2012-01-12 ENCOUNTER — Encounter: Payer: Self-pay | Admitting: Cardiology

## 2012-01-12 ENCOUNTER — Ambulatory Visit (INDEPENDENT_AMBULATORY_CARE_PROVIDER_SITE_OTHER): Payer: PRIVATE HEALTH INSURANCE | Admitting: Cardiology

## 2012-01-12 ENCOUNTER — Encounter: Payer: Self-pay | Admitting: *Deleted

## 2012-01-12 ENCOUNTER — Ambulatory Visit: Payer: PRIVATE HEALTH INSURANCE | Admitting: Nurse Practitioner

## 2012-01-12 VITALS — BP 151/91 | HR 104 | Ht 72.0 in | Wt 227.0 lb

## 2012-01-12 DIAGNOSIS — I4891 Unspecified atrial fibrillation: Secondary | ICD-10-CM

## 2012-01-12 DIAGNOSIS — I10 Essential (primary) hypertension: Secondary | ICD-10-CM

## 2012-01-12 DIAGNOSIS — I420 Dilated cardiomyopathy: Secondary | ICD-10-CM | POA: Insufficient documentation

## 2012-01-12 LAB — CBC WITH DIFFERENTIAL/PLATELET
Basophils Absolute: 0.1 10*3/uL (ref 0.0–0.1)
Eosinophils Relative: 1.2 % (ref 0.0–5.0)
Monocytes Absolute: 0.7 10*3/uL (ref 0.1–1.0)
Monocytes Relative: 8.2 % (ref 3.0–12.0)
Neutrophils Relative %: 69.2 % (ref 43.0–77.0)
Platelets: 276 10*3/uL (ref 150.0–400.0)
RDW: 12.5 % (ref 11.5–14.6)
WBC: 8.6 10*3/uL (ref 4.5–10.5)

## 2012-01-12 LAB — BASIC METABOLIC PANEL
CO2: 24 mEq/L (ref 19–32)
GFR: 103.67 mL/min (ref >60.00)
Glucose, Bld: 104 mg/dL — ABNORMAL HIGH (ref 70–99)
Potassium: 4 mEq/L (ref 3.5–5.1)
Sodium: 140 mEq/L (ref 135–145)

## 2012-01-12 MED ORDER — METOPROLOL SUCCINATE ER 50 MG PO TB24: ORAL_TABLET | ORAL | Status: DC

## 2012-01-12 NOTE — Progress Notes (Signed)
   HPI: pleasant male for followup of atrial fibrillation. Previously seen in may of 2012 for this issue. An electrocardiogram preoperatively showed asymptomatic atrial fibrillation but he converted spontaneously to sinus rhythm. LV function was normal at that time. TSH normal. Myoview in May of 2012 showed normal perfusion. Study not gated because of PACs. Patient had recurrent atrial fibrillation in Dec 2013 and an echocardiogram revealed an ejection fraction of 40-45%. There was mild left atrial enlargement, mild right atrial enlargement and right ventricular enlargement. Patient's metoprolol was increased and xeralto initiated. There were plans for cardioversion if he did not convert on his own. Since he was last seen, he notes some dyspnea on exertion but no orthopnea, PND, pedal edema or syncope. No bleeding. Occasional twinge of chest pain for one second but no sustained pain. Occasional brief palpitations. He has mild fatigue.   Current Outpatient Prescriptions  Medication Sig Dispense Refill  . aspirin 81 MG chewable tablet Chew 1 tablet (81 mg total) by mouth daily.  30 tablet  0  . metoprolol succinate (TOPROL-XL) 50 MG 24 hr tablet Take 1 tablet (50 mg total) by mouth daily. Take with or immediately following a meal.  60 tablet  6  . Rivaroxaban (XARELTO) 20 MG TABS Take 1 tablet (20 mg total) by mouth daily.  30 tablet  6     Past Medical History  Diagnosis Date  . Hypertension   . A-fib   . OA (osteoarthritis)     Past Surgical History  Procedure Date  . Left shoulder surgery   . Appendectomy   . Joint replacement   . Total hip arthroplasty     History   Social History  . Marital Status: Single    Spouse Name: N/A    Number of Children: 2  . Years of Education: N/A   Occupational History  .     Social History Main Topics  . Smoking status: Never Smoker   . Smokeless tobacco: Not on file  . Alcohol Use: 0.6 oz/week    1 Cans of beer per week     Comment: daily    . Drug Use: No  . Sexually Active: Not on file   Other Topics Concern  . Not on file   Social History Narrative  . No narrative on file    ROS: no fevers or chills, productive cough, hemoptysis, dysphasia, odynophagia, melena, hematochezia, dysuria, hematuria, rash, seizure activity, orthopnea, PND, pedal edema, claudication. Remaining systems are negative.  Physical Exam: Well-developed well-nourished in no acute distress.  Skin is warm and dry.  HEENT is normal.  Neck is supple.  Chest is clear to auscultation with normal expansion.  Cardiovascular exam is irregular and tachycardic Abdominal exam nontender or distended. No masses palpated. Extremities show no edema. neuro grossly intact  ECG atrial fibrillation at a rate of 104. Nonspecific ST changes.

## 2012-01-12 NOTE — Assessment & Plan Note (Signed)
Patient's LV function is mildly reduced. This may be related to atrial fibrillation and tachycardia. There may also be a contribution from hypertension. Previous Myoview negative. Plan to repeat echocardiogram in the future after sinus reestablished.

## 2012-01-12 NOTE — Assessment & Plan Note (Signed)
Patient has developed recurrent atrial fibrillation. He has been on xeralto since December 10. I would like for him to be on this for 24 days prior to cardioversion. We therefore can proceed next week. If he develops recurrent atrial fibrillation in the future we will consider antiarrhythmic versus ablation. Increase Toprol to 75 mg daily for rate control. Hold Toprol the morning of his Cardioversion. He has embolic risk factor of hypertension. He does not have insurance to cover medications. His CHADS score is 1. He will continue anticoagulation for 4 weeks following procedure and we will most likely change to aspirin at that point.

## 2012-01-12 NOTE — Patient Instructions (Addendum)
Your physician recommends that you schedule a follow-up appointment in: 4 WEEKS WITH DR Jens Som  Your physician recommends that you HAVE LAB WORK TODAY  INCREASE METOPROLOL TO 50 MG TAKE ONE AND ONE HALF TABLETS ONCE DAILY  Your physician has recommended that you have a Cardioversion (DCCV). Electrical Cardioversion uses a jolt of electricity to your heart either through paddles or wired patches attached to your chest. This is a controlled, usually prescheduled, procedure. Defibrillation is done under light anesthesia in the hospital, and you usually go home the day of the procedure. This is done to get your heart back into a normal rhythm. You are not awake for the procedure. Please see the instruction sheet given to you today.

## 2012-01-12 NOTE — Assessment & Plan Note (Signed)
Increase Toprol to 75 mg daily as blood pressure mildly elevated.

## 2012-01-16 ENCOUNTER — Other Ambulatory Visit: Payer: Self-pay | Admitting: Cardiology

## 2012-01-16 DIAGNOSIS — I4891 Unspecified atrial fibrillation: Secondary | ICD-10-CM

## 2012-01-16 MED ORDER — SODIUM CHLORIDE 0.9 % IJ SOLN: 3.0000 mL | Freq: Two times a day (BID) | INTRAMUSCULAR | Status: DC

## 2012-01-16 MED ORDER — HYDROCORTISONE 1 % EX CREA: 1.0000 "application " | TOPICAL_CREAM | Freq: Three times a day (TID) | CUTANEOUS | Status: DC | PRN

## 2012-01-16 MED ORDER — SODIUM CHLORIDE 0.9 % IJ SOLN: 3.0000 mL | INTRAMUSCULAR | Status: DC | PRN

## 2012-01-16 MED ORDER — SODIUM CHLORIDE 0.9 % IV SOLN: 250.0000 mL | INTRAVENOUS | Status: DC

## 2012-01-18 ENCOUNTER — Ambulatory Visit (HOSPITAL_COMMUNITY)
Admission: RE | Admit: 2012-01-18 | Discharge: 2012-01-18 | Disposition: A | Discharge status: Home / Self Care | Payer: PRIVATE HEALTH INSURANCE | Source: Ambulatory Visit | Attending: Cardiology | Admitting: Cardiology

## 2012-01-18 ENCOUNTER — Encounter (HOSPITAL_COMMUNITY): Payer: Self-pay | Admitting: *Deleted

## 2012-01-18 ENCOUNTER — Ambulatory Visit (HOSPITAL_COMMUNITY): Payer: PRIVATE HEALTH INSURANCE | Admitting: *Deleted

## 2012-01-18 ENCOUNTER — Encounter (HOSPITAL_COMMUNITY)
Admission: RE | Disposition: A | Discharge status: Home / Self Care | Payer: Self-pay | Source: Ambulatory Visit | Attending: Cardiology

## 2012-01-18 DIAGNOSIS — I4891 Unspecified atrial fibrillation: Secondary | ICD-10-CM

## 2012-01-18 DIAGNOSIS — Z7901 Long term (current) use of anticoagulants: Secondary | ICD-10-CM | POA: Insufficient documentation

## 2012-01-18 DIAGNOSIS — I10 Essential (primary) hypertension: Secondary | ICD-10-CM | POA: Insufficient documentation

## 2012-01-18 HISTORY — PX: CARDIOVERSION: SHX1299

## 2012-01-18 SURGERY — CARDIOVERSION
Anesthesia: Monitor Anesthesia Care

## 2012-01-18 MED ORDER — SODIUM CHLORIDE 0.9 % IV SOLN
INTRAVENOUS | Status: DC | PRN
  Administered 2012-01-18: 12:00:00 via INTRAVENOUS

## 2012-01-18 MED ORDER — SODIUM CHLORIDE 0.9 % IV SOLN
INTRAVENOUS | Status: DC
  Administered 2012-01-18: 500 mL via INTRAVENOUS

## 2012-01-18 MED ORDER — PROPOFOL 10 MG/ML IV BOLUS
INTRAVENOUS | Status: DC | PRN
  Administered 2012-01-18: 140 mg via INTRAVENOUS

## 2012-01-18 NOTE — H&P (Signed)
Lance Hunt   01/12/2012 9:45 AM Office Visit  MRN: [id number]   Description: 52 year old male  Provider: Lewayne Bunting, MD  Department: [facility]         Diagnoses     Atrial fibrillation   - Primary    427.31    Essential hypertension     401.9      Reason for Visit     Follow-up    Atrial fibrillation         Progress Notes     [name], MD  01/12/2012 10:48 AM  Signed    HPI: pleasant male for followup of atrial fibrillation. Previously seen in may of 2012 for this issue. An electrocardiogram preoperatively showed asymptomatic atrial fibrillation but he converted spontaneously to sinus rhythm. LV function was normal at that time. TSH normal. Myoview in May of 2012 showed normal perfusion. Study not gated because of PACs. Patient had recurrent atrial fibrillation in Dec 2013 and an echocardiogram revealed an ejection fraction of 40-45%. There was mild left atrial enlargement, mild right atrial enlargement and right ventricular enlargement. Patient's metoprolol was increased and xeralto initiated. There were plans for cardioversion if he did not convert on his own. Since he was last seen, he notes some dyspnea on exertion but no orthopnea, PND, pedal edema or syncope. No bleeding. Occasional twinge of chest pain for one second but no sustained pain. Occasional brief palpitations. He has mild fatigue.      Current Outpatient Prescriptions   Medication  Sig  Dispense  Refill   .  aspirin 81 MG chewable tablet  Chew 1 tablet (81 mg total) by mouth daily.   30 tablet   0   .  metoprolol succinate (TOPROL-XL) 50 MG 24 hr tablet  Take 1 tablet (50 mg total) by mouth daily. Take with or immediately following a meal.   60 tablet   6   .  Rivaroxaban (XARELTO) 20 MG TABS  Take 1 tablet (20 mg total) by mouth daily.   30 tablet   6         Past Medical History   Diagnosis  Date   .  Hypertension     .  A-fib     .  OA (osteoarthritis)           Past Surgical History   Procedure  Date   .  Left shoulder surgery     .  Appendectomy     .  Joint replacement     .  Total hip arthroplasty         History       Social History   .  Marital Status:  Single       Spouse Name:  N/A       Number of Children:  2   .  Years of Education:  N/A       Occupational History   .           Social History Main Topics   .  Smoking status:  Never Smoker    .  Smokeless tobacco:  Not on file   .  Alcohol Use:  0.6 oz/week       1 Cans of beer per week         Comment: daily   .  Drug Use:  No   .  Sexually Active:  Not on file       Other  Topics  Concern   .  Not on file       Social History Narrative   .  No narrative on file      ROS: no fevers or chills, productive cough, hemoptysis, dysphasia, odynophagia, melena, hematochezia, dysuria, hematuria, rash, seizure activity, orthopnea, PND, pedal edema, claudication. Remaining systems are negative.   Physical Exam: Well-developed well-nourished in no acute distress.   Skin is warm and dry.   HEENT is normal.   Neck is supple.   Chest is clear to auscultation with normal expansion.   Cardiovascular exam is irregular and tachycardic Abdominal exam nontender or distended. No masses palpated. Extremities show no edema. neuro grossly intact   ECG atrial fibrillation at a rate of 104. Nonspecific ST changes.              Atrial fibrillation - [name], MD  01/12/2012 10:27 AM  Signed Patient has developed recurrent atrial fibrillation. He has been on xeralto since December 10. I would like for him to be on this for 24 days prior to cardioversion. We therefore can proceed next week. If he develops recurrent atrial fibrillation in the future we will consider antiarrhythmic versus ablation. Increase Toprol to 75 mg daily for rate control. Hold Toprol the morning of his Cardioversion. He has embolic risk factor of hypertension. He does not have insurance to cover  medications. His CHADS score is 1. He will continue anticoagulation for 4 weeks following procedure and we will most likely change to aspirin at that point.  Congestive dilated cardiomyopathy - [name], MD  01/12/2012 10:28 AM  Signed Patient's LV function is mildly reduced. This may be related to atrial fibrillation and tachycardia. There may also be a contribution from hypertension. Previous Myoview negative. Plan to repeat echocardiogram in the future after sinus reestablished.  Essential hypertension - [name], MD  01/12/2012 10:29 AM  Signed Increase Toprol to 75 mg daily as blood pressure mildly elevated.   For DCCV today; no changes. [name]

## 2012-01-18 NOTE — Preoperative (Signed)
Beta Blockers   Reason not to administer Beta Blockers:Not Applicable 

## 2012-01-18 NOTE — Anesthesia Preprocedure Evaluation (Signed)
Anesthesia Evaluation  Patient identified by MRN, date of birth, ID band Patient awake    Reviewed: Allergy & Precautions, H&P , NPO status , Patient's Chart, lab work & pertinent test results  History of Anesthesia Complications Negative for: history of anesthetic complications  Airway Mallampati: II TM Distance: >3 FB Neck ROM: Full    Dental  (+) Dental Advisory Given   Pulmonary neg pulmonary ROS,          Cardiovascular hypertension, Pt. on medications and Pt. on home beta blockers + dysrhythmias     Neuro/Psych negative neurological ROS     GI/Hepatic negative GI ROS, Neg liver ROS,   Endo/Other  negative endocrine ROS  Renal/GU negative Renal ROS  negative genitourinary   Musculoskeletal   Abdominal   Peds  Hematology negative hematology ROS (+)   Anesthesia Other Findings   Reproductive/Obstetrics                           Anesthesia Physical Anesthesia Plan  ASA: III  Anesthesia Plan: General   Post-op Pain Management:    Induction: Intravenous  Airway Management Planned: Mask  Additional Equipment:   Intra-op Plan:   Post-operative Plan:   Informed Consent:   Dental advisory given  Plan Discussed with: CRNA, Anesthesiologist and Surgeon  Anesthesia Plan Comments:         Anesthesia Quick Evaluation

## 2012-01-18 NOTE — Procedures (Signed)
Electrical Cardioversion Procedure Note Lance Hunt [id number] 07-20-1960  Procedure: Electrical Cardioversion Indications:  Atrial Fibrillation  Procedure Details Consent: Risks of procedure as well as the alternatives and risks of each were explained to the (patient/caregiver).  Consent for procedure obtained. Time Out: Verified patient identification, verified procedure, site/side was marked, verified correct patient position, special equipment/implants available, medications/allergies/relevent history reviewed, required imaging and test results available.  Performed  Patient placed on cardiac monitor, pulse oximetry, supplemental oxygen as necessary.  Sedation given: Diprovan 140 mg IV administered by anesthesia. Pacer pads placed anterior and posterior chest.  Cardioverted 1 time(s).  Cardioverted at 120J.  Evaluation Findings: Post procedure EKG shows: NSR Complications: None Patient did tolerate procedure well.   Lance Hunt 01/18/2012, 12:09 PM

## 2012-01-18 NOTE — Transfer of Care (Signed)
Immediate Anesthesia Transfer of Care Note  Patient: Lance Hunt  Procedure(s) Performed: Procedure(s) (LRB) with comments: CARDIOVERSION (N/A)  Patient Location:Endo PACU  Anesthesia Type:General  Level of Consciousness: awake, alert  and oriented  Airway & Oxygen Therapy: good aw on [place] 0 2 at 2 l/min  Post-op Assessment: Report given to PACU RN and Post -op Vital signs reviewed and stable  Post vital signs: Reviewed and stable  Complications: No apparent anesthesia complications

## 2012-01-18 NOTE — Anesthesia Postprocedure Evaluation (Signed)
  Anesthesia Post-op Note  Patient: Lance Hunt  Procedure(s) Performed: Procedure(s) (LRB) with comments: CARDIOVERSION (N/A)  Patient Location: PACU and Endoscopy Unit  Anesthesia Type:General  Level of Consciousness: awake, alert  and oriented  Airway and Oxygen Therapy: Patient Spontanous Breathing  Post-op Pain: none  Post-op Assessment: Post-op Vital signs reviewed  Post-op Vital Signs: Reviewed and stable  Complications: No apparent anesthesia complications

## 2012-01-19 ENCOUNTER — Encounter (HOSPITAL_COMMUNITY): Payer: Self-pay | Admitting: Cardiology

## 2012-01-26 ENCOUNTER — Telehealth: Payer: Self-pay | Admitting: Cardiology

## 2012-01-26 NOTE — Telephone Encounter (Signed)
Discussed with dr Jens Som, explained to pt there are other medicine we need to try and to keep his follow up appt. Pt agreed with this plan.

## 2012-01-26 NOTE — Telephone Encounter (Signed)
Spoke with pt, he went out of rhythm yesterday. His bp cuff reads his heart rate at 101-97. Will discuss with dr Jens Som.

## 2012-01-26 NOTE — Telephone Encounter (Signed)
New Problem:    Patient called in because he had a cardioversion recently, states that it is no longer working and would like to know how to proceed.  Please call back.

## 2012-02-09 ENCOUNTER — Encounter: Payer: Self-pay | Admitting: Cardiology

## 2012-02-09 ENCOUNTER — Ambulatory Visit (INDEPENDENT_AMBULATORY_CARE_PROVIDER_SITE_OTHER): Payer: PRIVATE HEALTH INSURANCE | Admitting: Cardiology

## 2012-02-09 VITALS — BP 124/96 | HR 103 | Ht 72.0 in | Wt 228.1 lb

## 2012-02-09 DIAGNOSIS — I420 Dilated cardiomyopathy: Secondary | ICD-10-CM

## 2012-02-09 DIAGNOSIS — I4891 Unspecified atrial fibrillation: Secondary | ICD-10-CM

## 2012-02-09 DIAGNOSIS — I10 Essential (primary) hypertension: Secondary | ICD-10-CM

## 2012-02-09 DIAGNOSIS — I428 Other cardiomyopathies: Secondary | ICD-10-CM

## 2012-02-09 MED ORDER — METOPROLOL SUCCINATE ER 100 MG PO TB24: 100.0000 mg | ORAL_TABLET | Freq: Every day | ORAL | Status: DC

## 2012-02-09 NOTE — Assessment & Plan Note (Signed)
Patient has developed recurrent atrial fibrillation status post cardioversion. His weight is mildly increased. Increase Toprol to 100 mg daily. Continue Xeralto. We discussed options today including rate control/anticoagulation versus rhythm control. He would prefer definitive therapy. We discussed antiarrhythmic versus ablation. He would like to see Dr. Johney Frame for consideration of ablation. We will arrange that.

## 2012-02-09 NOTE — Patient Instructions (Addendum)
Your physician wants you to follow-up in: 4 MONTHS WITH DR Jens Som You will receive a reminder letter in the mail two months in advance. If you don't receive a letter, please call our office to schedule the follow-up appointment.   INCREASE METOPROLOL TO 100 MG ONCE DAILY  REFERRAL TO DR Johney Frame TO DISCUSS ABLATION

## 2012-02-09 NOTE — Assessment & Plan Note (Signed)
-   Continue Toprol 

## 2012-02-09 NOTE — Assessment & Plan Note (Signed)
LV function mildly reduced on most recent echocardiogram. Potentially related to atrial fibrillation and tachycardia. Plan repeat study once we reestablish and maintain sinus rhythm.

## 2012-02-09 NOTE — Progress Notes (Signed)
   HPI: Pleasant male for followup of atrial fibrillation. Previously seen in may of 2012 for this issue. An electrocardiogram preoperatively showed asymptomatic atrial fibrillation but he converted spontaneously to sinus rhythm. LV function was normal at that time. TSH normal. Myoview in May of 2012 showed normal perfusion. Study not gated because of PACs. Patient had recurrent atrial fibrillation in Dec 2013 and an echocardiogram revealed an ejection fraction of 40-45%. There was mild left atrial enlargement, mild right atrial enlargement and right ventricular enlargement. Patient's metoprolol was increased and xeralto initiated. Patient successfully cardioverted on 01/18/2012. Since that time, he reverted back to atrial fibrillation approximately one week after cardioversion. He notes mild dyspnea on exertion compared to when he is in sinus rhythm. There is no orthopnea, PND, pedal edema, exertional chest pain or syncope.   Current Outpatient Prescriptions  Medication Sig Dispense Refill  . metoprolol succinate (TOPROL-XL) 50 MG 24 hr tablet TAKE ONE AND ONE HALF TABLETS ONCE DAILY Take with or immediately following a meal.  75 tablet  12  . Rivaroxaban (XARELTO) 20 MG TABS Take 1 tablet (20 mg total) by mouth daily.  30 tablet  6     Past Medical History  Diagnosis Date  . Hypertension   . A-fib   . OA (osteoarthritis)     Past Surgical History  Procedure Date  . Left shoulder surgery   . Appendectomy   . Joint replacement   . Total hip arthroplasty   . Cardioversion 01/18/2012    Procedure: CARDIOVERSION;  Surgeon: Lewayne Bunting, MD;  Location: [facility] ENDOSCOPY;  Service: Cardiovascular;  Laterality: N/A;    History   Social History  . Marital Status: Single    Spouse Name: N/A    Number of Children: 2  . Years of Education: N/A   Occupational History  .     Social History Main Topics  . Smoking status: Never Smoker   . Smokeless tobacco: Not on file  . Alcohol Use: 0.6  oz/week    1 Cans of beer per week     Comment: daily  . Drug Use: No  . Sexually Active: Not on file   Other Topics Concern  . Not on file   Social History Narrative  . No narrative on file    ROS: no fevers or chills, productive cough, hemoptysis, dysphasia, odynophagia, melena, hematochezia, dysuria, hematuria, rash, seizure activity, orthopnea, PND, pedal edema, claudication. Remaining systems are negative.  Physical Exam: Well-developed well-nourished in no acute distress.  Skin is warm and dry.  HEENT is normal.  Neck is supple.  Chest is clear to auscultation with normal expansion.  Cardiovascular exam is irregular Abdominal exam nontender or distended. No masses palpated. Extremities show no edema. neuro grossly intact  ECG atrial fibrillation at a rate of 103. Normal axis. No ST changes.

## 2012-02-12 ENCOUNTER — Ambulatory Visit (INDEPENDENT_AMBULATORY_CARE_PROVIDER_SITE_OTHER): Payer: PRIVATE HEALTH INSURANCE | Admitting: Internal Medicine

## 2012-02-12 ENCOUNTER — Encounter: Payer: Self-pay | Admitting: Internal Medicine

## 2012-02-12 VITALS — BP 143/93 | HR 90 | Ht 72.0 in | Wt 227.0 lb

## 2012-02-12 DIAGNOSIS — I428 Other cardiomyopathies: Secondary | ICD-10-CM

## 2012-02-12 DIAGNOSIS — I420 Dilated cardiomyopathy: Secondary | ICD-10-CM

## 2012-02-12 DIAGNOSIS — I4891 Unspecified atrial fibrillation: Secondary | ICD-10-CM

## 2012-02-12 DIAGNOSIS — I10 Essential (primary) hypertension: Secondary | ICD-10-CM

## 2012-02-12 MED ORDER — METOPROLOL SUCCINATE ER 100 MG PO TB24: ORAL_TABLET | ORAL | Status: DC

## 2012-02-12 NOTE — Assessment & Plan Note (Signed)
As above.

## 2012-02-12 NOTE — Progress Notes (Signed)
Referring Physician:  Dr Terrall Laity Lance Hunt is a 52 y.o. male with a h/o persistent atrial fibrillation, HTN, and EF 40-45% who presents today for EP consultation regarding his atrial fibrillation.  He reports initially being diagnosed with atrial fibrillation 2 years ago after being found to have afib when he presented for a routine surgical eval.   He was completely asymptomatic at the time and the full duration of his afib is unknown.  He was treated with metoprolol.  Eventually he stopped taking metoprolol and was lost to follow-up. He did reasonably well.  He recently presented and was again found to have afib.  He was evaluated by Dr Jens Som and placed on xarelto.  He underwent cardioversion but returned to afib within a week or so.  At this time, he does not recall feeling any better in sinus rhythm.  Recent echo reveals EF 40-45%.  He has since been initiated on metoprolol for rate control.  He is now referred for EP consultation.  Today, he denies symptoms of palpitations, chest pain, shortness of breath, orthopnea, PND, lower extremity edema, dizziness, presyncope, syncope, or neurologic sequela. The patient is tolerating medications without difficulties and is otherwise without complaint today.   Past Medical History  Diagnosis Date  . Hypertension   . Persistent atrial fibrillation   . OA (osteoarthritis)   . Depressed ejection fraction    Past Surgical History  Procedure Date  . Left shoulder surgery   . Appendectomy   . Joint replacement   . Total hip arthroplasty   . Cardioversion 01/18/2012    Procedure: CARDIOVERSION;  Surgeon: Lewayne Bunting, MD;  Location: [facility] ENDOSCOPY;  Service: Cardiovascular;  Laterality: N/A;    Current Outpatient Prescriptions  Medication Sig Dispense Refill  . metoprolol succinate (TOPROL-XL) 100 MG 24 hr tablet Take 100mg  every am and 20mg  in the pm  30 tablet  12  . Rivaroxaban (XARELTO) 20 MG TABS Take 1 tablet (20 mg total)  by mouth daily.  30 tablet  6    No Known Allergies  History   Social History  . Marital Status: Single    Spouse Name: N/A    Number of Children: 2  . Years of Education: N/A   Occupational History  .     Social History Main Topics  . Smoking status: Never Smoker   . Smokeless tobacco: Not on file  . Alcohol Use: 0.6 oz/week    1 Cans of beer per week     Comment: daily  . Drug Use: No  . Sexually Active: Not on file   Other Topics Concern  . Not on file   Social History Narrative  . No narrative on file    Family History  Problem Relation Age of Onset  . Heart disease Mother     ROS- All systems are reviewed and negative except as per the HPI above  Physical Exam: Filed Vitals:   02/12/12 1455  BP: 143/93  Pulse: 90  Height: 6' (1.829 m)  Weight: 227 lb (102.967 kg)    GEN- The patient is well appearing, alert and oriented x 3 today.   Head- normocephalic, atraumatic Eyes-  Sclera clear, conjunctiva pink Ears- hearing intact Oropharynx- clear Neck- supple, no JVP Lymph- no cervical lymphadenopathy Lungs- Clear to ausculation bilaterally, normal work of breathing Heart- irregular rate and rhythm, no murmurs, rubs or gallops, PMI not laterally displaced GI- soft, NT, ND, + BS Extremities- no clubbing, cyanosis,  or edema MS- no significant deformity or atrophy Skin- no rash or lesion Psych- euthymic mood, full affect Neuro- strength and sensation are intact  EKG 02/09/12 reveals afib 103 bpm, nonspecifc ST/T changes Echo is also reviewed  Assessment and Plan:

## 2012-02-12 NOTE — Assessment & Plan Note (Signed)
Increase toprol as above

## 2012-02-12 NOTE — Patient Instructions (Signed)
Your physician recommends that you schedule a follow-up appointment as needed  Your physician has recommended you make the following change in your medication:  1) Increase your Metoprolol to 100mg  in the am and 25mg  in the pm(1/2 of a 50mg  tablet)

## 2012-02-12 NOTE — Assessment & Plan Note (Addendum)
The patient has persistent atrial fibrillation.  He is at most minimally symptomatic.  His EF is 40-45% and this is possibly due to elevated V rates with afib.  He has been initiated on beta blockers by Dr Jens Som with significant improvements in heart rates.  He is tolerating this without difficulty.  His CHADS2 score is 2 (HTN, low EF) and is appropriately anticoagulated with xarelto.  He presents today for Ep consultation. Therapeutic strategies for afib including medicine and ablation were discussed in detail with the patient today. We discussed rate vs rhythm control as options.  Given his paucity of symptoms, I think that rate control would be reasonable with follow-up of his EF by echo in the future.  His best medicine option would be tikosyn however he has no insurance coverage for medicines and therefore would not be able to afford this long term.  I would not recommend amiodarone given his young age. Per HRS guidelines for treatment of persistent afib, catheter ablation should not be offered until he has failed medical therapy and is reserved only for treatment of symptoms related to afib.  For this reason, he is not a catheter ablation candidate.  Given his probable longstanding persistent afib, anticipated success rates for ablation would be 50-60% and would almost certainly require more than 1 procedure. Though I have no data that supports stopping anticoagulation following ablation, I did briefly mention the watchman left atrial occluder device which may be an option for him if it receives FDA approval later this year.  At this point, I think that ongoing rate control is best.  I will increase toprol to 100mg  qam and 25mg  qpm.  He will follow-up with Dr Jens Som and I will see as needed going forward.

## 2012-02-13 ENCOUNTER — Telehealth: Payer: Self-pay | Admitting: Cardiology

## 2012-02-13 NOTE — Telephone Encounter (Signed)
New Problem:    Patient called in wanting to discuss his latest OV with Dr. Jens Som.  Please call back.

## 2012-02-13 NOTE — Telephone Encounter (Signed)
Spoke with pt, he wants to make sure dr Jens Som is aware of his office visit with dr allred and what was discussed. He wants to make sure the plan for him is what needs to be done. Will forward for dr Jens Som review

## 2012-02-13 NOTE — Telephone Encounter (Signed)
Plan as outlined by Dr Erle Crocker

## 2012-02-15 ENCOUNTER — Telehealth: Payer: Self-pay | Admitting: Cardiology

## 2012-02-15 NOTE — Telephone Encounter (Signed)
Spoke with pt, aware of dr crenshaw's recommendations. 

## 2012-02-15 NOTE — Telephone Encounter (Signed)
Left message for pt of dr crenshaw's recommendations  

## 2012-02-15 NOTE — Telephone Encounter (Signed)
Pt rtn call to debra °

## 2012-02-16 NOTE — Telephone Encounter (Signed)
Discussed pt with dr Jens Som, he has a lot of questions and confusion regarding atrial fib and his treatment plan. Pt scheduled for a follow up appt to discuss.

## 2012-03-01 ENCOUNTER — Ambulatory Visit (INDEPENDENT_AMBULATORY_CARE_PROVIDER_SITE_OTHER): Payer: PRIVATE HEALTH INSURANCE | Admitting: Cardiology

## 2012-03-01 ENCOUNTER — Encounter: Payer: Self-pay | Admitting: Cardiology

## 2012-03-01 VITALS — BP 120/90 | HR 97 | Ht 72.0 in | Wt 228.0 lb

## 2012-03-01 DIAGNOSIS — I10 Essential (primary) hypertension: Secondary | ICD-10-CM

## 2012-03-01 DIAGNOSIS — I4891 Unspecified atrial fibrillation: Secondary | ICD-10-CM

## 2012-03-01 DIAGNOSIS — I428 Other cardiomyopathies: Secondary | ICD-10-CM

## 2012-03-01 DIAGNOSIS — I420 Dilated cardiomyopathy: Secondary | ICD-10-CM

## 2012-03-01 MED ORDER — METOPROLOL SUCCINATE ER 100 MG PO TB24: ORAL_TABLET | ORAL | Status: DC

## 2012-03-01 NOTE — Assessment & Plan Note (Signed)
We again discussed the options today. These include rate control and anticoagulation versus rhythm control. He is not really symptomatic and Dr. Johney Frame recommended rate control. His rate is high normal. Increase Toprol to 100 mg in the morning and 50 in the evening. In one week we will schedule a 48 hour Holter monitor to make sure that his rate is adequately controlled. We have settled on rate control and anticoagulation. Continue xeralto. Embolic risk factors of hypertension and mildly reduced LV function.

## 2012-03-01 NOTE — Assessment & Plan Note (Signed)
-   Continue Toprol 

## 2012-03-01 NOTE — Assessment & Plan Note (Signed)
LV function mildly reduced and felt potentially related to tachycardia. Continue to titrate rate control medications. Once fully titrated we'll repeat echocardiogram to reassess LV function. Continue beta blocker.

## 2012-03-01 NOTE — Progress Notes (Signed)
   HPI: Pleasant male for followup of atrial fibrillation. Previously seen in may of 2012 for this issue. An electrocardiogram preoperatively showed asymptomatic atrial fibrillation but he converted spontaneously to sinus rhythm. LV function was normal at that time. TSH normal. Myoview in May of 2012 showed normal perfusion. Study not gated because of PACs. Patient had recurrent atrial fibrillation in Dec 2013 and an echocardiogram revealed an ejection fraction of 40-45%. There was mild left atrial enlargement, mild right atrial enlargement and right ventricular enlargement. Patient's metoprolol was increased and xeralto initiated. Patient successfully cardioverted on 01/18/2012. Since that time, he reverted back to atrial fibrillation approximately one week after cardioversion. We asked Dr. Johney Frame to review and he suggested rate control and anticoagulation as he felt the patient was asymptomatic. Since that time, he has dyspnea with more extreme activities but not routine activities. No orthopnea, PND, pedal edema, palpitations, syncope or chest pain. No bleeding.   Current Outpatient Prescriptions  Medication Sig Dispense Refill  . metoprolol succinate (TOPROL-XL) 100 MG 24 hr tablet Take 100mg  every am and 25mg  in the pm      . Rivaroxaban (XARELTO) 20 MG TABS Take 1 tablet (20 mg total) by mouth daily.  30 tablet  6   No current facility-administered medications for this visit.     Past Medical History  Diagnosis Date  . Hypertension   . Persistent atrial fibrillation   . OA (osteoarthritis)   . Depressed ejection fraction     Past Surgical History  Procedure Laterality Date  . Left shoulder surgery    . Appendectomy    . Joint replacement    . Total hip arthroplasty    . Cardioversion  01/18/2012    Procedure: CARDIOVERSION;  Surgeon: Lewayne Bunting, MD;  Location: [facility] ENDOSCOPY;  Service: Cardiovascular;  Laterality: N/A;    History   Social History  . Marital Status: Single    Spouse Name: N/A    Number of Children: 2  . Years of Education: N/A   Occupational History  .     Social History Main Topics  . Smoking status: Never Smoker   . Smokeless tobacco: Not on file  . Alcohol Use: 0.6 oz/week    1 Cans of beer per week     Comment: daily  . Drug Use: No  . Sexually Active: Not on file   Other Topics Concern  . Not on file   Social History Narrative  . No narrative on file    ROS: no fevers or chills, productive cough, hemoptysis, dysphasia, odynophagia, melena, hematochezia, dysuria, hematuria, rash, seizure activity, orthopnea, PND, pedal edema, claudication. Remaining systems are negative.  Physical Exam: Well-developed well-nourished in no acute distress.  Skin is warm and dry.  HEENT is normal.  Neck is supple.  Chest is clear to auscultation with normal expansion.  Cardiovascular exam is irregular Abdominal exam nontender or distended. No masses palpated. Extremities show no edema. neuro grossly intact  ECG atrial fibrillation at a rate of 97. Nonspecific ST changes.

## 2012-03-01 NOTE — Patient Instructions (Addendum)
Your physician recommends that you schedule a follow-up appointment in: 3 MONTHS WITH DR CRENSHAW  INCREASE METOPROLOL XL TO 100 MG IN THE AM AND 50 MG IN THE PM  Your physician has recommended that you wear a 48 HOUR holter monitor. Holter monitors are medical devices that record the heart's electrical activity. Doctors most often use these monitors to diagnose arrhythmias. Arrhythmias are problems with the speed or rhythm of the heartbeat. The monitor is a small, portable device. You can wear one while you do your normal daily activities. This is usually used to diagnose what is causing palpitations/syncope (passing out).

## 2012-03-13 ENCOUNTER — Encounter (INDEPENDENT_AMBULATORY_CARE_PROVIDER_SITE_OTHER): Payer: PRIVATE HEALTH INSURANCE

## 2012-03-13 ENCOUNTER — Telehealth: Payer: Self-pay | Admitting: *Deleted

## 2012-03-13 DIAGNOSIS — I4891 Unspecified atrial fibrillation: Secondary | ICD-10-CM

## 2012-03-13 NOTE — Telephone Encounter (Signed)
48 hr holter monitor placed on Pt 03/13/12 TK

## 2012-03-28 ENCOUNTER — Telehealth: Payer: Self-pay | Admitting: Cardiology

## 2012-03-28 DIAGNOSIS — I4891 Unspecified atrial fibrillation: Secondary | ICD-10-CM

## 2012-03-28 MED ORDER — RIVAROXABAN 20 MG PO TABS: 20.0000 mg | ORAL_TABLET | Freq: Every day | ORAL | Status: DC

## 2012-03-28 MED ORDER — METOPROLOL SUCCINATE ER 100 MG PO TB24: ORAL_TABLET | ORAL | Status: DC

## 2012-03-28 NOTE — Telephone Encounter (Signed)
New Problem:   Triage: Patient called in wanting to know the results of his recent monitor.   Refill: Patient called in needing a 90 day refill of his metoprolol succinate (TOPROL-XL) 100 MG 24 hr tablet and Rivaroxaban (XARELTO) 20 MG TABS.  Please call back once this has been filled.

## 2012-03-28 NOTE — Telephone Encounter (Signed)
Spoke with pt regarding his monitor.  Advised pt that once the report has been reviewed and interpreted by Dr. Jens Som, we would be back in touch with him.  Pt agreed.

## 2012-04-04 ENCOUNTER — Telehealth: Payer: Self-pay | Admitting: *Deleted

## 2012-04-04 NOTE — Telephone Encounter (Signed)
Spoke with pt, aware monitor reviewed by dr Jens Som shows a fib with PVC's or aberrantly conducted beats. Rate reasonably controlled. No change in meds at this time.

## 2012-05-30 ENCOUNTER — Ambulatory Visit (INDEPENDENT_AMBULATORY_CARE_PROVIDER_SITE_OTHER): Payer: PRIVATE HEALTH INSURANCE | Admitting: Cardiology

## 2012-05-30 ENCOUNTER — Encounter: Payer: Self-pay | Admitting: Cardiology

## 2012-05-30 VITALS — BP 140/80 | HR 104 | Ht 72.0 in | Wt 223.0 lb

## 2012-05-30 DIAGNOSIS — I428 Other cardiomyopathies: Secondary | ICD-10-CM

## 2012-05-30 DIAGNOSIS — I429 Cardiomyopathy, unspecified: Secondary | ICD-10-CM

## 2012-05-30 DIAGNOSIS — I4891 Unspecified atrial fibrillation: Secondary | ICD-10-CM

## 2012-05-30 MED ORDER — METOPROLOL SUCCINATE ER 100 MG PO TB24: ORAL_TABLET | ORAL | Status: DC

## 2012-05-30 NOTE — Assessment & Plan Note (Signed)
Blood pressure controlled. Continue present medications. 

## 2012-05-30 NOTE — Assessment & Plan Note (Signed)
Patient remains in atrial fibrillation.we have decided to treat with rate control and anticoagulation. Heart rate mildly elevated. Increase Toprol to 100 in the morning and 50 in the evening. Continue xeralto. Embolic risk factors are mildly reduced LV function and hypertension. Check hemoglobin and renal function.

## 2012-05-30 NOTE — Patient Instructions (Addendum)
Your physician wants you to follow-up in: 6 MONTHS with Dr Jens Som.  You will receive a reminder letter in the mail two months in advance. If you don't receive a letter, please call our office to schedule the follow-up appointment.  Your physician has requested that you have an echocardiogram. Echocardiography is a painless test that uses sound waves to create images of your heart. It provides your doctor with information about the size and shape of your heart and how well your heart's chambers and valves are working. This procedure takes approximately one hour. There are no restrictions for this procedure.  Increase Metoprolol to 100 mg every morning and 50 mg every evening.  Your physician recommends that you have lab work today.

## 2012-05-30 NOTE — Assessment & Plan Note (Signed)
LV function mildly reduced previously. Possibly related to atrial fibrillation. Not a rate has improved plan repeat echocardiogram. Continue beta blocker.

## 2012-05-30 NOTE — Progress Notes (Signed)
   HPI: Pleasant male for followup of atrial fibrillation. Previously seen in May of 2012 for this issue. An electrocardiogram preoperatively showed asymptomatic atrial fibrillation but he converted spontaneously to sinus rhythm. LV function was normal at that time. TSH normal. Myoview in May of 2012 showed normal perfusion. Study not gated because of PACs. Patient had recurrent atrial fibrillation in Dec 2013 and an echocardiogram revealed an ejection fraction of 40-45%. There was mild left atrial enlargement, mild right atrial enlargement and right ventricular enlargement. Patient's metoprolol was increased and xeralto initiated. Patient successfully cardioverted on 01/18/2012 but reverted to atrial fibrillation. We asked Dr. Johney Frame to review and he suggested rate control and anticoagulation as he felt the patient was asymptomatic. Holter monitor in March 2014 showed atrial fibrillation with rate reasonably well controlled. Since last saw him, he has dyspnea with more extreme activities but not routine activities. No orthopnea, PND, pedal edema, syncope, chest pain. No bleeding.   Current Outpatient Prescriptions  Medication Sig Dispense Refill  . metoprolol succinate (TOPROL-XL) 100 MG 24 hr tablet Take 100mg  every am and 25 mg in the pm      . Rivaroxaban (XARELTO) 20 MG TABS Take 1 tablet (20 mg total) by mouth daily.  90 tablet  3   No current facility-administered medications for this visit.     Past Medical History  Diagnosis Date  . Hypertension   . Persistent atrial fibrillation   . OA (osteoarthritis)   . Depressed ejection fraction     Past Surgical History  Procedure Laterality Date  . Left shoulder surgery    . Appendectomy    . Joint replacement    . Total hip arthroplasty    . Cardioversion  01/18/2012    Procedure: CARDIOVERSION;  Surgeon: Lewayne Bunting, MD;  Location: [facility] ENDOSCOPY;  Service: Cardiovascular;  Laterality: N/A;    History   Social History  . Marital  Status: Single    Spouse Name: N/A    Number of Children: 2  . Years of Education: N/A   Occupational History  .     Social History Main Topics  . Smoking status: Never Smoker   . Smokeless tobacco: Not on file  . Alcohol Use: 0.6 oz/week    1 Cans of beer per week     Comment: daily  . Drug Use: No  . Sexually Active: Not on file   Other Topics Concern  . Not on file   Social History Narrative  . No narrative on file    ROS: no fevers or chills, productive cough, hemoptysis, dysphasia, odynophagia, melena, hematochezia, dysuria, hematuria, rash, seizure activity, orthopnea, PND, pedal edema, claudication. Remaining systems are negative.  Physical Exam: Well-developed well-nourished in no acute distress.  Skin is warm and dry.  HEENT is normal.  Neck is supple.  Chest is clear to auscultation with normal expansion.  Cardiovascular exam is irregular Abdominal exam nontender or distended. No masses palpated. Extremities show no edema. neuro grossly intact  ECG atrial fibrillation at a rate of 104. Nonspecific ST changes.

## 2012-05-31 LAB — CBC WITH DIFFERENTIAL/PLATELET
Basophils Absolute: 0 10*3/uL (ref 0.0–0.1)
Eosinophils Absolute: 0.1 10*3/uL (ref 0.0–0.7)
HCT: 46.5 % (ref 39.0–52.0)
Hemoglobin: 16.1 g/dL (ref 13.0–17.0)
Lymphs Abs: 2.6 10*3/uL (ref 0.7–4.0)
MCHC: 34.6 g/dL (ref 30.0–36.0)
MCV: 94.2 fl (ref 78.0–100.0)
Monocytes Absolute: 0.6 10*3/uL (ref 0.1–1.0)
Monocytes Relative: 6.7 % (ref 3.0–12.0)
Neutro Abs: 5.6 10*3/uL (ref 1.4–7.7)
Platelets: 261 10*3/uL (ref 150.0–400.0)
RDW: 13.5 % (ref 11.5–14.6)

## 2012-05-31 LAB — BASIC METABOLIC PANEL
CO2: 22 mEq/L (ref 19–32)
Calcium: 9.5 mg/dL (ref 8.4–10.5)
Creatinine, Ser: 1 mg/dL (ref 0.4–1.5)
GFR: 81.6 mL/min (ref >60.00)
Sodium: 141 mEq/L (ref 135–145)

## 2012-06-07 ENCOUNTER — Ambulatory Visit (HOSPITAL_COMMUNITY): Payer: PRIVATE HEALTH INSURANCE | Attending: Cardiology | Admitting: Radiology

## 2012-06-07 DIAGNOSIS — I517 Cardiomegaly: Secondary | ICD-10-CM | POA: Insufficient documentation

## 2012-06-07 DIAGNOSIS — I429 Cardiomyopathy, unspecified: Secondary | ICD-10-CM

## 2012-06-07 DIAGNOSIS — I428 Other cardiomyopathies: Secondary | ICD-10-CM | POA: Insufficient documentation

## 2012-06-07 DIAGNOSIS — I4891 Unspecified atrial fibrillation: Secondary | ICD-10-CM | POA: Insufficient documentation

## 2012-06-07 NOTE — Progress Notes (Signed)
Echocardiogram performed.  

## 2012-08-19 ENCOUNTER — Telehealth: Payer: Self-pay | Admitting: Cardiology

## 2012-08-19 NOTE — Telephone Encounter (Signed)
New Prob ° °Pt would like to speak with you, he did not say what it was concerning.  °

## 2012-08-19 NOTE — Telephone Encounter (Signed)
Spoke with pt, he had two episodes last week of sharpe, stabbing pain in chest and he felt lightheaded. By Saturday the discomfort was also in the left arm. He went to the ER. Everything checkout fine he was calling to let me know where to et his records for [place] to review. [facility] [phone]. Will review records and call the pt back.

## 2012-08-21 NOTE — Telephone Encounter (Signed)
Left message for pt, records are here, dr Jens Som out this week. Will call once reviewed.

## 2012-08-28 ENCOUNTER — Telehealth: Payer: Self-pay | Admitting: *Deleted

## 2012-08-28 NOTE — Telephone Encounter (Signed)
Left message for pt to call, dr Jens Som has reviewed his medical records from [facility]. The pt was told by the MD at that facility he needed to have an ablation. According to dr allred's office note the pt will need to try tikosyn and fail before he would be a candidate for ablation. Will discuss with pt when he returns the call.

## 2012-08-28 NOTE — Telephone Encounter (Signed)
F/u ° ° °Pt returning your call °

## 2012-09-06 NOTE — Telephone Encounter (Signed)
Spoke with pt, aware I have talked with dr allred about his atrial fib and potential ablation. Per dr Johney Frame the pt will need to try tikosyn, and fail it, before his insurance will agree to pay for ablation. Pt voiced understanding. Follow up appt made for pt to see dr Jens Som and discuss tikosyn admission.

## 2012-09-06 NOTE — Telephone Encounter (Signed)
F/u ° ° °Pt returning your call. Please call pt. °

## 2012-09-11 ENCOUNTER — Ambulatory Visit (INDEPENDENT_AMBULATORY_CARE_PROVIDER_SITE_OTHER): Payer: PRIVATE HEALTH INSURANCE | Admitting: Cardiology

## 2012-09-11 ENCOUNTER — Encounter: Payer: Self-pay | Admitting: Cardiology

## 2012-09-11 VITALS — BP 146/107 | HR 93 | Ht 72.0 in | Wt 224.0 lb

## 2012-09-11 DIAGNOSIS — I10 Essential (primary) hypertension: Secondary | ICD-10-CM

## 2012-09-11 DIAGNOSIS — I428 Other cardiomyopathies: Secondary | ICD-10-CM

## 2012-09-11 DIAGNOSIS — I4891 Unspecified atrial fibrillation: Secondary | ICD-10-CM

## 2012-09-11 DIAGNOSIS — I420 Dilated cardiomyopathy: Secondary | ICD-10-CM

## 2012-09-11 NOTE — Progress Notes (Signed)
   HPI: Pleasant male for followup of atrial fibrillation. Previously seen in May of 2012 for this issue. An electrocardiogram preoperatively showed asymptomatic atrial fibrillation but he converted spontaneously to sinus rhythm. LV function was normal at that time. TSH normal. Myoview in May of 2012 showed normal perfusion. Study not gated because of PACs. Patient had recurrent atrial fibrillation in Dec 2013 and an echocardiogram revealed an ejection fraction of 40-45%. There was mild left atrial enlargement, mild right atrial enlargement and right ventricular enlargement. Patient's metoprolol was increased and xeralto initiated. Patient successfully cardioverted on 01/18/2012 but reverted to atrial fibrillation. We asked Dr. Johney Frame to review and he suggested rate control and anticoagulation as he felt the patient was asymptomatic. Holter monitor in March 2014 showed atrial fibrillation with rate reasonably well controlled. Last echocardiogram in May of 2014 showed an ejection fraction of 50-55% and mild left atrial enlargement. Patient seen in August of 2014 at [facility] system for chest pain. 1 enzyme negative. He denies exertional chest pain, dyspnea on exertion, orthopnea, PND, pedal edema, palpitations or syncope. No bleeding.   Current Outpatient Prescriptions  Medication Sig Dispense Refill  . metoprolol succinate (TOPROL-XL) 100 MG 24 hr tablet Take 100mg  every am and 50 mg in the pm      . Rivaroxaban (XARELTO) 20 MG TABS Take 1 tablet (20 mg total) by mouth daily.  90 tablet  3   No current facility-administered medications for this visit.     Past Medical History  Diagnosis Date  . Hypertension   . Persistent atrial fibrillation   . OA (osteoarthritis)   . Depressed ejection fraction     Past Surgical History  Procedure Laterality Date  . Left shoulder surgery    . Appendectomy    . Joint replacement    . Total hip arthroplasty    . Cardioversion  01/18/2012   Procedure: CARDIOVERSION;  Surgeon: Lewayne Bunting, MD;  Location: [facility] ENDOSCOPY;  Service: Cardiovascular;  Laterality: N/A;    History   Social History  . Marital Status: Single    Spouse Name: N/A    Number of Children: 2  . Years of Education: N/A   Occupational History  .     Social History Main Topics  . Smoking status: Never Smoker   . Smokeless tobacco: Not on file  . Alcohol Use: 0.6 oz/week    1 Cans of beer per week     Comment: daily  . Drug Use: No  . Sexual Activity: Not on file   Other Topics Concern  . Not on file   Social History Narrative  . No narrative on file    ROS: no fevers or chills, productive cough, hemoptysis, dysphasia, odynophagia, melena, hematochezia, dysuria, hematuria, rash, seizure activity, orthopnea, PND, pedal edema, claudication. Remaining systems are negative.  Physical Exam: Well-developed well-nourished in no acute distress.  Skin is warm and dry.  HEENT is normal.  Neck is supple.  Chest is clear to auscultation with normal expansion.  Cardiovascular exam is irregular  Abdominal exam nontender or distended. No masses palpated. Extremities show no edema. neuro grossly intact

## 2012-09-11 NOTE — Assessment & Plan Note (Signed)
Blood pressure is elevated. Continue Toprol. He will follow his blood pressure at home and we will adjust medicines as needed.

## 2012-09-11 NOTE — Addendum Note (Signed)
Addended by: Freddi Starr on: 09/11/2012 05:34 PM   Modules accepted: Orders

## 2012-09-11 NOTE — Assessment & Plan Note (Signed)
Plan continue Toprol. Embolic risk factors of hypertension and history of mildly reduced LV function. Continue xeralto. He remains relatively asymptomatic. He was previously evaluated by Dr. Johney Frame. It was felt ablation not indicated given that he was asymptomatic and that rate control and anticoagulation was appropriate. He would like a second opinion concerning this. We will arrange an evaluation at [facility] as he lives in [place].

## 2012-09-11 NOTE — Patient Instructions (Addendum)
Your physician wants you to follow-up in: 6 MONTHS WITH DR Jens Som  You will receive a reminder letter in the mail two months in advance. If you don't receive a letter, please call our office to schedule the follow-up appointment.   REFERRAL TO ELECTROPHYSIOLOGIST IN [place]

## 2012-09-11 NOTE — Assessment & Plan Note (Signed)
LV function improved on most recent echocardiogram.previous mild reduction may have been related to tachycardia.

## 2012-09-12 ENCOUNTER — Telehealth: Payer: Self-pay | Admitting: Cardiology

## 2012-09-12 NOTE — Telephone Encounter (Signed)
New Problem  Pt states sharon called to schedule an appt for a new cardiologist in [place]

## 2013-01-24 NOTE — Telephone Encounter (Signed)
NO OTHER INFO °

## 2013-02-12 HISTORY — PX: OTHER SURGICAL HISTORY: SHX169

## 2013-04-04 ENCOUNTER — Other Ambulatory Visit: Payer: Self-pay | Admitting: *Deleted

## 2013-04-04 MED ORDER — RIVAROXABAN 20 MG PO TABS: 20.0000 mg | ORAL_TABLET | Freq: Every day | ORAL | Status: DC

## 2013-04-23 NOTE — Telephone Encounter (Signed)
error 

## 2013-10-20 ENCOUNTER — Encounter: Payer: Self-pay | Admitting: Cardiovascular Disease

## 2013-10-22 NOTE — H&P (Signed)
TOTAL HIP ADMISSION H&P  Patient is admitted for left total hip arthroplasty, anterior approach.  Subjective:  Chief Complaint: Left hip OA / pain  HPI: Lance Hunt, 53 y.o. male, has a history of pain and functional disability in the left hip(s) due to arthritis and patient has failed non-surgical conservative treatments for greater than 12 weeks to include NSAID's and/or analgesics and activity modification.  Onset of symptoms was gradual starting 1+ years ago with gradually worsening course since that time.The patient noted prior procedures of the hip to include arthroplasty on the right hip per Dr. Charlann Boxerlin.  Patient currently rates pain in the left hip at 5 out of 10 with activity. Patient has worsening of pain with activity and weight bearing, trendelenberg gait, pain that interfers with activities of daily living and pain with passive range of motion. Patient has evidence of periarticular osteophytes and joint space narrowing by imaging studies. This condition presents safety issues increasing the risk of falls. There is no current active infection.  Risks, benefits and expectations were discussed with the patient.  Risks including but not limited to the risk of anesthesia, blood clots, nerve damage, blood vessel damage, failure of the prosthesis, infection and up to and including death.  Patient understand the risks, benefits and expectations and wishes to proceed with surgery.    PCP: Pcp Not In System  D/C Plans:      Home with HHPT  Post-op Meds:       No Rx given  Tranexamic Acid:      To be given - IV  Decadron:      Is to be given  FYI:     ASA post-op  Norco post-op    Patient Active Problem List   Diagnosis Date Noted  . Congestive dilated cardiomyopathy 01/12/2012  . Essential hypertension 01/12/2012  . Preop cardiovascular exam 05/26/2010  . Atrial fibrillation 05/26/2010   Past Medical History  Diagnosis Date  . Hypertension   . Persistent atrial  fibrillation   . OA (osteoarthritis)   . Depressed ejection fraction     Past Surgical History  Procedure Laterality Date  . Left shoulder surgery    . Appendectomy    . Joint replacement    . Total hip arthroplasty    . Cardioversion  01/18/2012    Procedure: CARDIOVERSION;  Surgeon: Lewayne BuntingBrian S Crenshaw, MD;  Location: [facility] ENDOSCOPY;  Service: Cardiovascular;  Laterality: N/A;    No prescriptions prior to admission   No Known Allergies   History  Substance Use Topics  . Smoking status: Never Smoker   . Smokeless tobacco: Not on file  . Alcohol Use: 0.6 oz/week    1 Cans of beer per week     Comment: daily    Family History  Problem Relation Age of Onset  . Heart disease Mother      Review of Systems  Constitutional: Negative.   HENT: Negative.   Eyes: Negative.   Respiratory: Negative.   Cardiovascular: Negative.   Gastrointestinal: Negative.   Genitourinary: Negative.   Musculoskeletal: Positive for joint pain.  Skin: Negative.   Neurological: Negative.   Endo/Heme/Allergies: Negative.   Psychiatric/Behavioral: Negative.     Objective:  Physical Exam  Constitutional: He is oriented to person, place, and time. He appears well-developed and well-nourished.  HENT:  Head: Normocephalic and atraumatic.  Eyes: Pupils are equal, round, and reactive to light.  Neck: Neck supple. No JVD present. No tracheal deviation present. No thyromegaly present.  Cardiovascular: Normal rate, regular rhythm, normal heart sounds and intact distal pulses.   Respiratory: Effort normal and breath sounds normal. No respiratory distress. He has no wheezes.  GI: Soft. There is no tenderness. There is no guarding.  Musculoskeletal:       Left hip: He exhibits decreased range of motion, decreased strength, tenderness and bony tenderness. He exhibits no swelling, no deformity and no laceration.  Lymphadenopathy:    He has no cervical adenopathy.  Neurological: He is alert and oriented to  person, place, and time.  Skin: Skin is warm and dry.  Psychiatric: He has a normal mood and affect.      Labs:  Estimated body mass index is 30.37 kg/(m^2) as calculated from the following:   Height as of 09/11/12: 6' (1.829 m).   Weight as of 09/11/12: 101.606 kg (224 lb).   Imaging Review Plain radiographs demonstrate severe degenerative joint disease of the left hip(s). The bone quality appears to be good for age and reported activity level.  Assessment/Plan:  End stage arthritis, left hip(s)  The patient history, physical examination, clinical judgement of the provider and imaging studies are consistent with end stage degenerative joint disease of the left hip(s) and total hip arthroplasty is deemed medically necessary. The treatment options including medical management, injection therapy, arthroscopy and arthroplasty were discussed at length. The risks and benefits of total hip arthroplasty were presented and reviewed. The risks due to aseptic loosening, infection, stiffness, dislocation/subluxation,  thromboembolic complications and other imponderables were discussed.  The patient acknowledged the explanation, agreed to proceed with the plan and consent was signed. Patient is being admitted for inpatient treatment for surgery, pain control, PT, OT, prophylactic antibiotics, VTE prophylaxis, progressive ambulation and ADL's and discharge planning.The patient is planning to be discharged home with home health services.      Anastasio AuerbachMatthew S. Susane Bey   PA-C  10/22/2013, 12:44 PM

## 2013-10-28 ENCOUNTER — Encounter (HOSPITAL_COMMUNITY): Payer: Self-pay | Admitting: Pharmacy Technician

## 2013-10-31 ENCOUNTER — Ambulatory Visit (HOSPITAL_COMMUNITY)
Admission: RE | Admit: 2013-10-31 | Discharge: 2013-10-31 | Disposition: A | Discharge status: Home / Self Care | Payer: PRIVATE HEALTH INSURANCE | Source: Ambulatory Visit | Attending: Anesthesiology | Admitting: Anesthesiology

## 2013-10-31 ENCOUNTER — Encounter (HOSPITAL_COMMUNITY): Payer: Self-pay

## 2013-10-31 ENCOUNTER — Encounter (HOSPITAL_COMMUNITY)
Admission: RE | Admit: 2013-10-31 | Discharge: 2013-10-31 | Disposition: A | Discharge status: Home / Self Care | Payer: PRIVATE HEALTH INSURANCE | Source: Ambulatory Visit | Attending: Orthopedic Surgery | Admitting: Orthopedic Surgery

## 2013-10-31 DIAGNOSIS — I4891 Unspecified atrial fibrillation: Secondary | ICD-10-CM

## 2013-10-31 DIAGNOSIS — Z01811 Encounter for preprocedural respiratory examination: Secondary | ICD-10-CM | POA: Diagnosis not present

## 2013-10-31 DIAGNOSIS — Z01812 Encounter for preprocedural laboratory examination: Secondary | ICD-10-CM | POA: Diagnosis present

## 2013-10-31 HISTORY — DX: Cardiac arrhythmia, unspecified: I49.9

## 2013-10-31 LAB — BASIC METABOLIC PANEL
Anion gap: 11 (ref 5–15)
BUN: 20 mg/dL (ref 6–23)
CHLORIDE: 106 meq/L (ref 96–112)
CO2: 23 mEq/L (ref 19–32)
Calcium: 9.2 mg/dL (ref 8.4–10.5)
Creatinine, Ser: 0.71 mg/dL (ref 0.50–1.35)
GFR calc non Af Amer: >90 mL/min (ref >90)
Glucose, Bld: 101 mg/dL — ABNORMAL HIGH (ref 70–99)
POTASSIUM: 4 meq/L (ref 3.7–5.3)
SODIUM: 140 meq/L (ref 137–147)

## 2013-10-31 LAB — URINALYSIS, ROUTINE W REFLEX MICROSCOPIC
Bilirubin Urine: NEGATIVE
Glucose, UA: NEGATIVE mg/dL
Hgb urine dipstick: NEGATIVE
Ketones, ur: NEGATIVE mg/dL
Leukocytes, UA: NEGATIVE
NITRITE: NEGATIVE
PROTEIN: NEGATIVE mg/dL
Specific Gravity, Urine: 1.022 (ref 1.005–1.030)
UROBILINOGEN UA: 1 mg/dL (ref 0.0–1.0)
pH: 6.5 (ref 5.0–8.0)

## 2013-10-31 LAB — CBC
HCT: 39 % (ref 39.0–52.0)
HEMOGLOBIN: 13.1 g/dL (ref 13.0–17.0)
MCH: 32.1 pg (ref 26.0–34.0)
MCHC: 33.6 g/dL (ref 30.0–36.0)
MCV: 95.6 fL (ref 78.0–100.0)
Platelets: 281 10*3/uL (ref 150–400)
RBC: 4.08 MIL/uL — AB (ref 4.22–5.81)
RDW: 13.1 % (ref 11.5–15.5)
WBC: 10.3 10*3/uL (ref 4.0–10.5)

## 2013-10-31 LAB — PROTIME-INR
INR: 1 (ref 0.00–1.49)
PROTHROMBIN TIME: 13.3 s (ref 11.6–15.2)

## 2013-10-31 LAB — SURGICAL PCR SCREEN
MRSA, PCR: NEGATIVE
Staphylococcus aureus: POSITIVE — AB

## 2013-10-31 LAB — APTT: aPTT: 27 seconds (ref 24–37)

## 2013-10-31 NOTE — Patient Instructions (Addendum)
YOUR SURGERY IS SCHEDULED AT [facility]  ON:  Tuesday  10/27  REPORT TO  SHORT STAY CENTER AT:  7:05 AM   DO NOT EAT OR DRINK ANYTHING AFTER MIDNIGHT THE NIGHT BEFORE YOUR SURGERY.  YOU MAY BRUSH YOUR TEETH, RINSE OUT YOUR MOUTH--BUT NO WATER, NO FOOD, NO CHEWING GUM, NO MINTS, NO CANDIES, NO CHEWING TOBACCO.  PLEASE TAKE THE FOLLOWING MEDICATIONS THE AM OF YOUR SURGERY WITH A FEW SIPS OF WATER:  AMLODIPINE,  FLECAINIDE,  METOPROLOL    DO NOT BRING VALUABLES, MONEY, CREDIT CARDS.  DO NOT WEAR JEWELRY, MAKE-UP, NAIL POLISH AND NO METAL PINS OR CLIPS IN YOUR HAIR. CONTACT LENS, DENTURES / PARTIALS, GLASSES SHOULD NOT BE WORN TO SURGERY AND IN MOST CASES-HEARING AIDS WILL NEED TO BE REMOVED.  BRING YOUR GLASSES CASE, ANY EQUIPMENT NEEDED FOR YOUR CONTACT LENS. FOR PATIENTS ADMITTED TO THE HOSPITAL--CHECK OUT TIME THE DAY OF DISCHARGE IS 11:00 AM.  ALL INPATIENT ROOMS ARE PRIVATE - WITH BATHROOM, TELEPHONE, TELEVISION AND WIFI INTERNET.    PLEASE BE AWARE THAT YOU MAY NEED ADDITIONAL BLOOD DRAWN DAY OF YOUR SURGERY  _______________________________________________________________________   [facility] - Preparing for Surgery Before surgery, you can play an important role.  Because skin is not sterile, your skin needs to be as free of germs as possible.  You can reduce the number of germs on your skin by washing with CHG (chlorahexidine gluconate) soap before surgery.  CHG is an antiseptic cleaner which kills germs and bonds with the skin to continue killing germs even after washing. Please DO NOT use if you have an allergy to CHG or antibacterial soaps.  If your skin becomes reddened/irritated stop using the CHG and inform your nurse when you arrive at Short Stay. Do not shave (including legs and underarms) for at least 48 hours prior to the first CHG shower.  You may shave your face/neck. Please follow these instructions carefully:  1.  Shower with CHG Soap the night before  surgery and the  morning of Surgery.  2.  If you choose to wash your hair, wash your hair first as usual with your  normal  shampoo.  3.  After you shampoo, rinse your hair and body thoroughly to remove the  shampoo.                           4.  Use CHG as you would any other liquid soap.  You can apply chg directly  to the skin and wash                       Gently with a scrungie or clean washcloth.  5.  Apply the CHG Soap to your body ONLY FROM THE NECK DOWN.   Do not use on face/ open                           Wound or open sores. Avoid contact with eyes, ears mouth and genitals (private parts).                       Wash face,  Genitals (private parts) with your normal soap.             6.  Wash thoroughly, paying special attention to the area where your surgery  will be performed.  7.  Thoroughly rinse your body  with warm water from the neck down.  8.  DO NOT shower/wash with your normal soap after using and rinsing off  the CHG Soap.                9.  Pat yourself dry with a clean towel.            10.  Wear clean pajamas.            11.  Place clean sheets on your bed the night of your first shower and do not  sleep with pets. Day of Surgery : Do not apply any lotions/deodorants the morning of surgery.  Please wear clean clothes to the hospital/surgery center.  FAILURE TO FOLLOW THESE INSTRUCTIONS MAY RESULT IN THE CANCELLATION OF YOUR SURGERY PATIENT SIGNATURE_________________________________  NURSE SIGNATURE__________________________________  ________________________________________________________________________   Rogelia MireIncentive Spirometer  An incentive spirometer is a tool that can help keep your lungs clear and active. This tool measures how well you are filling your lungs with each breath. Taking long deep breaths may help reverse or decrease the chance of developing breathing (pulmonary) problems (especially infection) following:  A long period of time when you are unable to  move or be active. BEFORE THE PROCEDURE   If the spirometer includes an indicator to show your best effort, your nurse or respiratory therapist will set it to a desired goal.  If possible, sit up straight or lean slightly forward. Try not to slouch.  Hold the incentive spirometer in an upright position. INSTRUCTIONS FOR USE  1. Sit on the edge of your bed if possible, or sit up as far as you can in bed or on a chair. 2. Hold the incentive spirometer in an upright position. 3. Breathe out normally. 4. Place the mouthpiece in your mouth and seal your lips tightly around it. 5. Breathe in slowly and as deeply as possible, raising the piston or the ball toward the top of the column. 6. Hold your breath for 3-5 seconds or for as long as possible. Allow the piston or ball to fall to the bottom of the column. 7. Remove the mouthpiece from your mouth and breathe out normally. 8. Rest for a few seconds and repeat Steps 1 through 7 at least 10 times every 1-2 hours when you are awake. Take your time and take a few normal breaths between deep breaths. 9. The spirometer may include an indicator to show your best effort. Use the indicator as a goal to work toward during each repetition. 10. After each set of 10 deep breaths, practice coughing to be sure your lungs are clear. If you have an incision (the cut made at the time of surgery), support your incision when coughing by placing a pillow or rolled up towels firmly against it. Once you are able to get out of bed, walk around indoors and cough well. You may stop using the incentive spirometer when instructed by your caregiver.  RISKS AND COMPLICATIONS  Take your time so you do not get dizzy or light-headed.  If you are in pain, you may need to take or ask for pain medication before doing incentive spirometry. It is harder to take a deep breath if you are having pain. AFTER USE  Rest and breathe slowly and easily.  It can be helpful to keep track of  a log of your progress. Your caregiver can provide you with a simple table to help with this. If you are using the spirometer at home, follow  these instructions: SEEK MEDICAL CARE IF:   You are having difficultly using the spirometer.  You have trouble using the spirometer as often as instructed.  Your pain medication is not giving enough relief while using the spirometer.  You develop fever of 100.5 F (38.1 C) or higher. SEEK IMMEDIATE MEDICAL CARE IF:   You cough up bloody sputum that had not been present before.  You develop fever of 102 F (38.9 C) or greater.  You develop worsening pain at or near the incision site. MAKE SURE YOU:   Understand these instructions.  Will watch your condition.  Will get help right away if you are not doing well or get worse. Document Released: 05/08/2006 Document Revised: 03/20/2011 Document Reviewed: 07/09/2006 ExitCare Patient Information 2014 ExitCare, [place].   ________________________________________________________________________  WHAT IS A BLOOD TRANSFUSION? Blood Transfusion Information  A transfusion is the replacement of blood or some of its parts. Blood is made up of multiple cells which provide different functions.  Red blood cells carry oxygen and are used for blood loss replacement.  White blood cells fight against infection.  Platelets control bleeding.  Plasma helps clot blood.  Other blood products are available for specialized needs, such as hemophilia or other clotting disorders. BEFORE THE TRANSFUSION  Who gives blood for transfusions?   Healthy volunteers who are fully evaluated to make sure their blood is safe. This is blood bank blood. Transfusion therapy is the safest it has ever been in the practice of medicine. Before blood is taken from a donor, a complete history is taken to make sure that person has no history of diseases nor engages in risky social behavior (examples are intravenous drug use or sexual  activity with multiple partners). The donor's travel history is screened to minimize risk of transmitting infections, such as malaria. The donated blood is tested for signs of infectious diseases, such as HIV and hepatitis. The blood is then tested to be sure it is compatible with you in order to minimize the chance of a transfusion reaction. If you or a relative donates blood, this is often done in anticipation of surgery and is not appropriate for emergency situations. It takes many days to process the donated blood. RISKS AND COMPLICATIONS Although transfusion therapy is very safe and saves many lives, the main dangers of transfusion include:   Getting an infectious disease.  Developing a transfusion reaction. This is an allergic reaction to something in the blood you were given. Every precaution is taken to prevent this. The decision to have a blood transfusion has been considered carefully by your caregiver before blood is given. Blood is not given unless the benefits outweigh the risks. AFTER THE TRANSFUSION  Right after receiving a blood transfusion, you will usually feel much better and more energetic. This is especially true if your red blood cells have gotten low (anemic). The transfusion raises the level of the red blood cells which carry oxygen, and this usually causes an energy increase.  The nurse administering the transfusion will monitor you carefully for complications. HOME CARE INSTRUCTIONS  No special instructions are needed after a transfusion. You may find your energy is better. Speak with your caregiver about any limitations on activity for underlying diseases you may have. SEEK MEDICAL CARE IF:   Your condition is not improving after your transfusion.  You develop redness or irritation at the intravenous (IV) site. SEEK IMMEDIATE MEDICAL CARE IF:  Any of the following symptoms occur over the next 12 hours:  Shaking chills.  You have a temperature by mouth above 102 F  (38.9 C), not controlled by medicine.  Chest, back, or muscle pain.  People around you feel you are not acting correctly or are confused.  Shortness of breath or difficulty breathing.  Dizziness and fainting.  You get a rash or develop hives.  You have a decrease in urine output.  Your urine turns a dark color or changes to pink, red, or brown. Any of the following symptoms occur over the next 10 days:  You have a temperature by mouth above 102 F (38.9 C), not controlled by medicine.  Shortness of breath.  Weakness after normal activity.  The white part of the eye turns yellow (jaundice).  You have a decrease in the amount of urine or are urinating less often.  Your urine turns a dark color or changes to pink, red, or brown. Document Released: 12/24/1999 Document Revised: 03/20/2011 Document Reviewed: 08/12/2007 [facility] Patient Information 2014 [place], [place].  _______________________________________________________________________

## 2013-10-31 NOTE — Pre-Procedure Instructions (Signed)
EKG AND CARDIOLOGY OFFICE NOTE ON CHART FROM DR. JOHN HOLSHOUSER- [facility]- AS WELL AS MOST RECENT STRESS ECHO REPORT 08-22-13 AND TRANSESOPHAGEAL ECHO REPORT 02-11-13. [facility]

## 2013-11-04 ENCOUNTER — Encounter (HOSPITAL_COMMUNITY)
Admission: RE | Disposition: A | Discharge status: Home Health | Payer: Self-pay | Source: Ambulatory Visit | Attending: Orthopedic Surgery

## 2013-11-04 ENCOUNTER — Inpatient Hospital Stay (HOSPITAL_COMMUNITY): Payer: PRIVATE HEALTH INSURANCE

## 2013-11-04 ENCOUNTER — Encounter (HOSPITAL_COMMUNITY): Payer: Self-pay

## 2013-11-04 ENCOUNTER — Encounter (HOSPITAL_COMMUNITY): Payer: PRIVATE HEALTH INSURANCE | Admitting: Anesthesiology

## 2013-11-04 ENCOUNTER — Inpatient Hospital Stay (HOSPITAL_COMMUNITY)
Admission: RE | Admit: 2013-11-04 | Discharge: 2013-11-05 | DRG: 470 | Disposition: A | Discharge status: Home Health | Payer: PRIVATE HEALTH INSURANCE | Source: Ambulatory Visit | Attending: Orthopedic Surgery | Admitting: Orthopedic Surgery

## 2013-11-04 ENCOUNTER — Inpatient Hospital Stay (HOSPITAL_COMMUNITY): Payer: PRIVATE HEALTH INSURANCE | Admitting: Anesthesiology

## 2013-11-04 DIAGNOSIS — Z96641 Presence of right artificial hip joint: Secondary | ICD-10-CM | POA: Diagnosis present

## 2013-11-04 DIAGNOSIS — M1612 Unilateral primary osteoarthritis, left hip: Secondary | ICD-10-CM | POA: Diagnosis present

## 2013-11-04 DIAGNOSIS — I10 Essential (primary) hypertension: Secondary | ICD-10-CM | POA: Diagnosis present

## 2013-11-04 DIAGNOSIS — Z96649 Presence of unspecified artificial hip joint: Secondary | ICD-10-CM

## 2013-11-04 DIAGNOSIS — I420 Dilated cardiomyopathy: Secondary | ICD-10-CM | POA: Diagnosis present

## 2013-11-04 DIAGNOSIS — Z23 Encounter for immunization: Secondary | ICD-10-CM

## 2013-11-04 DIAGNOSIS — I481 Persistent atrial fibrillation: Secondary | ICD-10-CM | POA: Diagnosis present

## 2013-11-04 DIAGNOSIS — Z8249 Family history of ischemic heart disease and other diseases of the circulatory system: Secondary | ICD-10-CM

## 2013-11-04 DIAGNOSIS — Z9889 Other specified postprocedural states: Secondary | ICD-10-CM

## 2013-11-04 DIAGNOSIS — M25552 Pain in left hip: Secondary | ICD-10-CM | POA: Diagnosis present

## 2013-11-04 HISTORY — PX: TOTAL HIP ARTHROPLASTY: SHX124

## 2013-11-04 LAB — TYPE AND SCREEN
ABO/RH(D): AB POS
Antibody Screen: NEGATIVE

## 2013-11-04 SURGERY — ARTHROPLASTY, HIP, TOTAL, ANTERIOR APPROACH
Anesthesia: Spinal | Site: Hip | Laterality: Left

## 2013-11-04 MED ORDER — ONDANSETRON HCL 4 MG PO TABS: 4.0000 mg | ORAL_TABLET | Freq: Four times a day (QID) | ORAL | Status: DC | PRN

## 2013-11-04 MED ORDER — CEFAZOLIN SODIUM-DEXTROSE 2-3 GM-% IV SOLR
INTRAVENOUS | Status: AC
  Filled 2013-11-04: qty 50

## 2013-11-04 MED ORDER — ALUM & MAG HYDROXIDE-SIMETH 200-200-20 MG/5ML PO SUSP: 30.0000 mL | ORAL | Status: DC | PRN

## 2013-11-04 MED ORDER — MIDAZOLAM HCL 2 MG/2ML IJ SOLN
INTRAMUSCULAR | Status: AC
  Filled 2013-11-04: qty 2

## 2013-11-04 MED ORDER — DOCUSATE SODIUM 100 MG PO CAPS
100.0000 mg | ORAL_CAPSULE | Freq: Two times a day (BID) | ORAL | Status: DC
  Administered 2013-11-04 – 2013-11-05 (×2): 100 mg via ORAL

## 2013-11-04 MED ORDER — AMLODIPINE BESYLATE 10 MG PO TABS
10.0000 mg | ORAL_TABLET | Freq: Every day | ORAL | Status: DC
  Administered 2013-11-05: 10 mg via ORAL
  Filled 2013-11-04: qty 1

## 2013-11-04 MED ORDER — MIDAZOLAM HCL 5 MG/5ML IJ SOLN
INTRAMUSCULAR | Status: DC | PRN
  Administered 2013-11-04: 1 mg via INTRAVENOUS
  Administered 2013-11-04: 2 mg via INTRAVENOUS

## 2013-11-04 MED ORDER — SODIUM CHLORIDE 0.9 % IV SOLN
1000.0000 mg | Freq: Once | INTRAVENOUS | Status: AC
  Administered 2013-11-04: 1000 mg via INTRAVENOUS
  Filled 2013-11-04: qty 10

## 2013-11-04 MED ORDER — ASPIRIN EC 325 MG PO TBEC
325.0000 mg | DELAYED_RELEASE_TABLET | Freq: Two times a day (BID) | ORAL | Status: DC
  Administered 2013-11-05: 325 mg via ORAL
  Filled 2013-11-04 (×3): qty 1

## 2013-11-04 MED ORDER — SODIUM CHLORIDE 0.9 % IV SOLN
100.0000 mL/h | INTRAVENOUS | Status: DC
  Administered 2013-11-04: 100 mL/h via INTRAVENOUS
  Filled 2013-11-04 (×3): qty 1000

## 2013-11-04 MED ORDER — CEFAZOLIN SODIUM-DEXTROSE 2-3 GM-% IV SOLR
2.0000 g | Freq: Four times a day (QID) | INTRAVENOUS | Status: AC
  Administered 2013-11-04 (×2): 2 g via INTRAVENOUS
  Filled 2013-11-04 (×2): qty 50

## 2013-11-04 MED ORDER — MENTHOL 3 MG MT LOZG
1.0000 | LOZENGE | OROMUCOSAL | Status: DC | PRN
  Filled 2013-11-04: qty 9

## 2013-11-04 MED ORDER — BISACODYL 10 MG RE SUPP: 10.0000 mg | Freq: Every day | RECTAL | Status: DC | PRN

## 2013-11-04 MED ORDER — POLYETHYLENE GLYCOL 3350 17 G PO PACK
17.0000 g | PACK | Freq: Two times a day (BID) | ORAL | Status: DC
  Administered 2013-11-05: 17 g via ORAL

## 2013-11-04 MED ORDER — SODIUM CHLORIDE 0.9 % IR SOLN
Status: DC | PRN
  Administered 2013-11-04: 1000 mL

## 2013-11-04 MED ORDER — PROPOFOL INFUSION 10 MG/ML OPTIME
INTRAVENOUS | Status: DC | PRN
  Administered 2013-11-04: 100 ug/kg/min via INTRAVENOUS

## 2013-11-04 MED ORDER — METOPROLOL TARTRATE 25 MG PO TABS
25.0000 mg | ORAL_TABLET | Freq: Two times a day (BID) | ORAL | Status: DC
  Administered 2013-11-04 – 2013-11-05 (×2): 25 mg via ORAL
  Filled 2013-11-04 (×3): qty 1

## 2013-11-04 MED ORDER — PROPOFOL 10 MG/ML IV BOLUS
INTRAVENOUS | Status: AC
  Filled 2013-11-04: qty 20

## 2013-11-04 MED ORDER — LACTATED RINGERS IV SOLN: INTRAVENOUS | Status: DC

## 2013-11-04 MED ORDER — DEXTROSE 5 % IV SOLN
500.0000 mg | Freq: Four times a day (QID) | INTRAVENOUS | Status: DC | PRN
  Administered 2013-11-04: 500 mg via INTRAVENOUS
  Filled 2013-11-04: qty 5

## 2013-11-04 MED ORDER — DIPHENHYDRAMINE HCL 25 MG PO CAPS: 25.0000 mg | ORAL_CAPSULE | Freq: Four times a day (QID) | ORAL | Status: DC | PRN

## 2013-11-04 MED ORDER — LACTATED RINGERS IV SOLN
INTRAVENOUS | Status: DC | PRN
  Administered 2013-11-04 (×4): via INTRAVENOUS

## 2013-11-04 MED ORDER — HYDROMORPHONE HCL 1 MG/ML IJ SOLN: 0.5000 mg | INTRAMUSCULAR | Status: DC | PRN

## 2013-11-04 MED ORDER — FLECAINIDE ACETATE 100 MG PO TABS
100.0000 mg | ORAL_TABLET | Freq: Two times a day (BID) | ORAL | Status: DC
  Administered 2013-11-04 – 2013-11-05 (×2): 100 mg via ORAL
  Filled 2013-11-04 (×3): qty 1

## 2013-11-04 MED ORDER — ONDANSETRON HCL 4 MG/2ML IJ SOLN: 4.0000 mg | Freq: Four times a day (QID) | INTRAMUSCULAR | Status: DC | PRN

## 2013-11-04 MED ORDER — METHOCARBAMOL 500 MG PO TABS
500.0000 mg | ORAL_TABLET | Freq: Four times a day (QID) | ORAL | Status: DC | PRN
  Administered 2013-11-04: 500 mg via ORAL
  Filled 2013-11-04 (×2): qty 1

## 2013-11-04 MED ORDER — MAGNESIUM CITRATE PO SOLN: 1.0000 | Freq: Once | ORAL | Status: AC | PRN

## 2013-11-04 MED ORDER — METOCLOPRAMIDE HCL 5 MG/ML IJ SOLN
5.0000 mg | Freq: Three times a day (TID) | INTRAMUSCULAR | Status: DC | PRN
Start: 2013-11-04 — End: 2013-11-05

## 2013-11-04 MED ORDER — CHLORHEXIDINE GLUCONATE 4 % EX LIQD
60.0000 mL | Freq: Once | CUTANEOUS | Status: DC
Start: 2013-11-04 — End: 2013-11-04

## 2013-11-04 MED ORDER — BUPIVACAINE HCL (PF) 0.75 % IJ SOLN
INTRAMUSCULAR | Status: DC | PRN
  Administered 2013-11-04: 15 mg via INTRATHECAL

## 2013-11-04 MED ORDER — FERROUS SULFATE 325 (65 FE) MG PO TABS
325.0000 mg | ORAL_TABLET | Freq: Three times a day (TID) | ORAL | Status: DC
  Administered 2013-11-04 – 2013-11-05 (×2): 325 mg via ORAL
  Filled 2013-11-04 (×5): qty 1

## 2013-11-04 MED ORDER — PHENOL 1.4 % MT LIQD
1.0000 | OROMUCOSAL | Status: DC | PRN
  Filled 2013-11-04: qty 177

## 2013-11-04 MED ORDER — LACTATED RINGERS IV SOLN
INTRAVENOUS | Status: DC
  Administered 2013-11-04: 1000 mL via INTRAVENOUS

## 2013-11-04 MED ORDER — FENTANYL CITRATE 0.05 MG/ML IJ SOLN
INTRAMUSCULAR | Status: DC | PRN
  Administered 2013-11-04: 100 ug via INTRAVENOUS

## 2013-11-04 MED ORDER — HYDROMORPHONE HCL 1 MG/ML IJ SOLN
INTRAMUSCULAR | Status: AC
  Filled 2013-11-04: qty 1

## 2013-11-04 MED ORDER — DEXAMETHASONE SODIUM PHOSPHATE 10 MG/ML IJ SOLN
10.0000 mg | Freq: Once | INTRAMUSCULAR | Status: AC
  Administered 2013-11-04: 10 mg via INTRAVENOUS

## 2013-11-04 MED ORDER — HYDROCODONE-ACETAMINOPHEN 7.5-325 MG PO TABS
1.0000 | ORAL_TABLET | ORAL | Status: DC
  Administered 2013-11-04 – 2013-11-05 (×5): 2 via ORAL
  Filled 2013-11-04 (×5): qty 2

## 2013-11-04 MED ORDER — CEFAZOLIN SODIUM-DEXTROSE 2-3 GM-% IV SOLR
2.0000 g | INTRAVENOUS | Status: AC
  Administered 2013-11-04: 2 g via INTRAVENOUS

## 2013-11-04 MED ORDER — HYDROMORPHONE HCL 1 MG/ML IJ SOLN
0.2500 mg | INTRAMUSCULAR | Status: DC | PRN
  Administered 2013-11-04: 0.5 mg via INTRAVENOUS

## 2013-11-04 MED ORDER — METOCLOPRAMIDE HCL 10 MG PO TABS: 5.0000 mg | ORAL_TABLET | Freq: Three times a day (TID) | ORAL | Status: DC | PRN

## 2013-11-04 MED ORDER — DEXAMETHASONE SODIUM PHOSPHATE 10 MG/ML IJ SOLN
10.0000 mg | Freq: Once | INTRAMUSCULAR | Status: AC
  Administered 2013-11-05: 10 mg via INTRAVENOUS
  Filled 2013-11-04: qty 1

## 2013-11-04 MED ORDER — DEXAMETHASONE SODIUM PHOSPHATE 10 MG/ML IJ SOLN
INTRAMUSCULAR | Status: AC
  Filled 2013-11-04: qty 1

## 2013-11-04 MED ORDER — CELECOXIB 200 MG PO CAPS
200.0000 mg | ORAL_CAPSULE | Freq: Two times a day (BID) | ORAL | Status: DC
  Administered 2013-11-04 – 2013-11-05 (×2): 200 mg via ORAL
  Filled 2013-11-04 (×3): qty 1

## 2013-11-04 MED ORDER — FENTANYL CITRATE 0.05 MG/ML IJ SOLN
INTRAMUSCULAR | Status: AC
  Filled 2013-11-04: qty 2

## 2013-11-04 MED ORDER — INFLUENZA VAC SPLIT QUAD 0.5 ML IM SUSY
0.5000 mL | PREFILLED_SYRINGE | INTRAMUSCULAR | Status: AC
  Administered 2013-11-05: 0.5 mL via INTRAMUSCULAR
  Filled 2013-11-04 (×2): qty 0.5

## 2013-11-04 MED ORDER — PROPOFOL 10 MG/ML IV BOLUS
INTRAVENOUS | Status: DC | PRN
  Administered 2013-11-04: 30 mg via INTRAVENOUS
  Administered 2013-11-04: 50 mg via INTRAVENOUS

## 2013-11-04 SURGICAL SUPPLY — 37 items
BAG ZIPLOCK 12X15 (MISCELLANEOUS) IMPLANT
CAPT HIP PF COP ×3 IMPLANT
COVER PERINEAL POST (MISCELLANEOUS) ×3 IMPLANT
DERMABOND ADVANCED (GAUZE/BANDAGES/DRESSINGS) ×2
DERMABOND ADVANCED .7 DNX12 (GAUZE/BANDAGES/DRESSINGS) ×1 IMPLANT
DRAPE C-ARM 42X120 X-RAY (DRAPES) ×3 IMPLANT
DRAPE STERI IOBAN 125X83 (DRAPES) ×3 IMPLANT
DRAPE U-SHAPE 47X51 STRL (DRAPES) ×9 IMPLANT
DRSG AQUACEL AG ADV 3.5X10 (GAUZE/BANDAGES/DRESSINGS) ×3 IMPLANT
DURAPREP 26ML APPLICATOR (WOUND CARE) ×3 IMPLANT
ELECT BLADE TIP CTD 4 INCH (ELECTRODE) ×3 IMPLANT
ELECT PENCIL ROCKER SW 15FT (MISCELLANEOUS) IMPLANT
ELECT REM PT RETURN 15FT ADLT (MISCELLANEOUS) IMPLANT
ELECT REM PT RETURN 9FT ADLT (ELECTROSURGICAL) ×3
ELECTRODE REM PT RTRN 9FT ADLT (ELECTROSURGICAL) ×1 IMPLANT
FACESHIELD WRAPAROUND (MASK) ×12 IMPLANT
GLOVE BIOGEL PI IND STRL 7.5 (GLOVE) ×1 IMPLANT
GLOVE BIOGEL PI IND STRL 8.5 (GLOVE) ×1 IMPLANT
GLOVE BIOGEL PI INDICATOR 7.5 (GLOVE) ×2
GLOVE BIOGEL PI INDICATOR 8.5 (GLOVE) ×2
GLOVE ECLIPSE 8.0 STRL XLNG CF (GLOVE) ×3 IMPLANT
GLOVE ORTHO TXT STRL SZ7.5 (GLOVE) ×6 IMPLANT
GOWN SPEC L3 XXLG W/TWL (GOWN DISPOSABLE) ×3 IMPLANT
GOWN STRL REUS W/TWL LRG LVL3 (GOWN DISPOSABLE) ×3 IMPLANT
HOLDER FOLEY CATH W/STRAP (MISCELLANEOUS) ×3 IMPLANT
KIT BASIN OR (CUSTOM PROCEDURE TRAY) ×3 IMPLANT
PACK TOTAL JOINT (CUSTOM PROCEDURE TRAY) ×3 IMPLANT
SAW OSC TIP CART 19.5X105X1.3 (SAW) ×3 IMPLANT
SUT MNCRL AB 4-0 PS2 18 (SUTURE) ×3 IMPLANT
SUT VIC AB 1 CT1 36 (SUTURE) ×9 IMPLANT
SUT VIC AB 2-0 CT1 27 (SUTURE) ×4
SUT VIC AB 2-0 CT1 TAPERPNT 27 (SUTURE) ×2 IMPLANT
SUT VLOC 180 0 24IN GS25 (SUTURE) ×3 IMPLANT
TOWEL OR 17X26 10 PK STRL BLUE (TOWEL DISPOSABLE) ×3 IMPLANT
TOWEL OR NON WOVEN STRL DISP B (DISPOSABLE) IMPLANT
TRAY FOLEY METER SIL LF 16FR (CATHETERS) ×3 IMPLANT
WATER STERILE IRR 1500ML POUR (IV SOLUTION) ×3 IMPLANT

## 2013-11-04 NOTE — Progress Notes (Signed)
Portable AP Pelvis and Lateral Left Hip X-rays done. 

## 2013-11-04 NOTE — Progress Notes (Signed)
Dr. Leta JunglingEwell made aware of patient's heart rates being 42-44- blood pressures stable- spinal level L3- O.K. To go to regular floor - per Dr. Leta JunglingEwell

## 2013-11-04 NOTE — Op Note (Signed)
NAME:  Lance Hunt                ACCOUNT NO.: [id number]      MEDICAL RECORD NO.: [id number]      FACILITY:  [facility]      PHYSICIAN:  Durene Romans D  DATE OF BIRTH:  Oct 25, 1960     DATE OF PROCEDURE:  11/04/2013                                 OPERATIVE REPORT         PREOPERATIVE DIAGNOSIS: Left  hip osteoarthritis.      POSTOPERATIVE DIAGNOSIS:  Left hip osteoarthritis. History if right total hip replacement     PROCEDURE:  Left total hip replacement through an anterior approach   utilizing DePuy THR system, component size 54mm pinnacle cup, a size 36+4 neutral   Altrex liner, a size 8 Hi Tri Lock stem with a 36+5 delta ceramic   ball.      SURGEON:  Madlyn Frankel. Charlann Boxer, M.D.      ASSISTANT:  Lanney Gins, PA-C      ANESTHESIA:  Spinal.      SPECIMENS:  None.      COMPLICATIONS:  None.      BLOOD LOSS:  1000 cc (predominantly from acetabular bone bleeing)     DRAINS:  None.      INDICATION OF THE PROCEDURE:  Lance Hunt is a 53 y.o. male who had   presented to office for evaluation of left hip pain.  Radiographs revealed   progressive degenerative changes with bone-on-bone   articulation to the  hip joint.  Again history of right total hip replacement that he has done well with.  Plan to match components, legs, and results.  The patient had painful limited range of   motion significantly affecting their overall quality of life.  The patient was failing to    respond to conservative measures, and at this point was ready   to proceed with more definitive measures.  The patient has noted progressive   degenerative changes in his hip, progressive problems and dysfunction   with regarding the hip prior to surgery.  Consent was obtained for   benefit of pain relief.  Specific risk of infection, DVT, component   failure, dislocation, need for revision surgery, as well discussion of   the anterior versus posterior approach were reviewed.  Consent  was   obtained for benefit of anterior pain relief through an anterior   approach.      PROCEDURE IN DETAIL:  The patient was brought to operative theater.   Once adequate anesthesia, preoperative antibiotics, 2gm of Ancef administered.   The patient was positioned supine on the OSI Hanna table.  Once adequate   padding of boney process was carried out, we had predraped out the hip, and  used fluoroscopy to confirm orientation of the pelvis and position.      The left hip was then prepped and draped from proximal iliac crest to   mid thigh with shower curtain technique.      Time-out was performed identifying the patient, planned procedure, and   extremity.     An incision was then made 2 cm distal and lateral to the   anterior superior iliac spine extending over the orientation of the   tensor fascia lata muscle and sharp dissection was carried down  to the   fascia of the muscle and protractor placed in the soft tissues.      The fascia was then incised.  The muscle belly was identified and swept   laterally and retractor placed along the superior neck.  Following   cauterization of the circumflex vessels and removing some pericapsular   fat, a second cobra retractor was placed on the inferior neck.  A third   retractor was placed on the anterior acetabulum after elevating the   anterior rectus.  A L-capsulotomy was along the line of the   superior neck to the trochanteric fossa, then extended proximally and   distally.  Tag sutures were placed and the retractors were then placed   intracapsular.  We then identified the trochanteric fossa and   orientation of my neck cut, confirmed this radiographically   and then made a neck osteotomy with the femur on traction.  The femoral   head was removed without difficulty or complication.  Traction was let   off and retractors were placed posterior and anterior around the   acetabulum.      The labrum and foveal tissue were debrided.  I  began reaming with a 49mm   reamer and reamed up to 53mm reamer with good bony bed preparation and a 54mm   cup was chosen.  The final 54mm Pinnacle cup was then impacted under fluoroscopy  to confirm the depth of penetration and orientation with respect to   abduction.  A screw was placed followed by the hole eliminator.  The final   36+4 neutral Altrex liner was impacted with good visualized rim fit.  The cup was positioned anatomically within the acetabular portion of the pelvis.      At this point, the femur was rolled at 80 degrees.  Further capsule was   released off the inferior aspect of the femoral neck.  I then   released the superior capsule proximally.  The hook was placed laterally   along the femur and elevated manually and held in position with the bed   hook.  The leg was then extended and adducted with the leg rolled to 100   degrees of external rotation.  Once the proximal femur was fully   exposed, I used a box osteotome to set orientation.  I then began   broaching with the starting chili pepper broach and passed this by hand and then broached up to 8 (same as contra-lateral hip).  With the 8 broach in place I chose a high offset neck and did a trial reduction.  The offset was appropriate, leg lengths   appeared to be best matched using the +5 trial head ball, confirmed radiographically.   Given these findings, I went ahead and dislocated the hip, repositioned all   retractors and positioned the right hip in the extended and abducted position.  The final 8 Hi Tri Lock stem was   chosen and it was impacted down to the level of neck cut.  Based on this   and the trial reduction, a 36+5 delta ceramic ball was chosen and   impacted onto a clean and dry trunnion, and the hip was reduced.  The   hip had been irrigated throughout the case again at this point.  I did   reapproximate the superior capsular leaflet to the anterior leaflet   using #1 Vicryl.  The fascia of the    tensor fascia lata muscle was then reapproximated using #1 Vicryl  and #0 V-lock sutures.  The   remaining wound was closed with 2-0 Vicryl and running 4-0 Monocryl.   The hip was cleaned, dried, and dressed sterilely using Dermabond and   Aquacel dressing.  He was then brought   to recovery room in stable condition tolerating the procedure well.    Lanney GinsMatthew Babish, PA-C was present for the entirety of the case involved from   preoperative positioning, perioperative retractor management, general   facilitation of the case, as well as primary wound closure as assistant.            Madlyn FrankelMatthew D. Charlann Boxerlin, M.D.        11/04/2013 11:17 AM

## 2013-11-04 NOTE — Anesthesia Preprocedure Evaluation (Addendum)
Anesthesia Evaluation  Patient identified by MRN, date of birth, ID band Patient awake    Reviewed: Allergy & Precautions, H&P , NPO status , Patient's Chart, lab work & pertinent test results, reviewed documented beta blocker date and time   Airway Mallampati: II  TM Distance: >3 FB Neck ROM: full    Dental no notable dental hx. (+) Teeth Intact, Dental Advisory Given   Pulmonary neg pulmonary ROS,  breath sounds clear to auscultation  Pulmonary exam normal       Cardiovascular hypertension, Pt. on home beta blockers and Pt. on medications +CHF + dysrhythmias Atrial Fibrillation Rhythm:Regular Rate:Normal  Persistent AF with ablation 2/15. Normal sinus rythym now.Congestive dilated cardiomyopathy in past.  OK now.   Neuro/Psych negative neurological ROS  negative psych ROS   GI/Hepatic negative GI ROS, Neg liver ROS,   Endo/Other  negative endocrine ROS  Renal/GU negative Renal ROS  negative genitourinary   Musculoskeletal   Abdominal   Peds  Hematology negative hematology ROS (+)   Anesthesia Other Findings   Reproductive/Obstetrics negative OB ROS                         Anesthesia Physical Anesthesia Plan  ASA: III  Anesthesia Plan: Spinal   Post-op Pain Management:    Induction:   Airway Management Planned:   Additional Equipment:   Intra-op Plan:   Post-operative Plan:   Informed Consent: I have reviewed the patients History and Physical, chart, labs and discussed the procedure including the risks, benefits and alternatives for the proposed anesthesia with the patient or authorized representative who has indicated his/her understanding and acceptance.   Dental Advisory Given  Plan Discussed with: CRNA and Surgeon  Anesthesia Plan Comments:        Anesthesia Quick Evaluation

## 2013-11-04 NOTE — Addendum Note (Signed)
Addendum created 11/04/13 1510 by Gaetano Hawthorneharles L Aundrea Horace, MD   Modules edited: Clinical Notes   Clinical Notes:  File: [id number]

## 2013-11-04 NOTE — Transfer of Care (Signed)
Immediate Anesthesia Transfer of Care Note  Patient: Collene LeydenSteven J Jasper  Procedure(s) Performed: Procedure(s): LEFT TOTAL HIP ARTHROPLASTY ANTERIOR APPROACH (Left)  Patient Location: PACU  Anesthesia Type:Spinal  Level of Consciousness: awake, alert , oriented and patient cooperative  Airway & Oxygen Therapy: Patient Spontanous Breathing and Patient connected to face mask oxygen  Post-op Assessment: Report given to PACU RN and Post -op Vital signs reviewed and stable  Post vital signs: stable  Complications: No apparent anesthesia complications  T 12

## 2013-11-04 NOTE — Interval H&P Note (Signed)
History and Physical Interval Note:  11/04/2013 7:01 AM  Lance Hunt  has presented today for surgery, with the diagnosis of LEFT HIP OA  The various methods of treatment have been discussed with the patient and family. After consideration of risks, benefits and other options for treatment, the patient has consented to  Procedure(s): LEFT TOTAL HIP ARTHROPLASTY ANTERIOR APPROACH (Left) as a surgical intervention .  The patient's history has been reviewed, patient examined, no change in status, stable for surgery.  I have reviewed the patient's chart and labs.  Questions were answered to the patient's satisfaction.     Shelda PalLIN,Rejina Odle D

## 2013-11-04 NOTE — Plan of Care (Signed)
Problem: Consults Goal: Diagnosis- Total Joint Replacement Outcome: Completed/Met Date Met:  11/04/13 Primary Total Hip LEFT, Anterior  Problem: Phase III Progression Outcomes Goal: Anticoagulant follow-up in place Outcome: Not Applicable Date Met:  87/19/[birth year] ASA VTE, no f/u needed.

## 2013-11-04 NOTE — Progress Notes (Signed)
X-ray results noted 

## 2013-11-04 NOTE — Anesthesia Procedure Notes (Signed)
Spinal  Patient location during procedure: OR Start time: 11/04/2013 9:55 AM End time: 11/04/2013 10:07 AM Staffing Anesthesiologist: Rod Mae L Performed by: anesthesiologist  Preanesthetic Checklist Completed: patient identified, site marked, surgical consent, pre-op evaluation, timeout performed, IV checked, risks and benefits discussed and monitors and equipment checked Spinal Block Patient position: sitting Prep: Betadine Patient monitoring: heart rate, continuous pulse ox and blood pressure Approach: midline Location: L3-4 Injection technique: single-shot Needle Needle type: Whitacre  Needle gauge: 25 G Needle length: 9 cm Assessment Sensory level: T6 Additional Notes Expiration date of kit checked and confirmed. Patient tolerated procedure well, without complications.

## 2013-11-04 NOTE — Anesthesia Postprocedure Evaluation (Signed)
  Anesthesia Post-op Note  Patient: Lance Hunt  Procedure(s) Performed: Procedure(s) (LRB): LEFT TOTAL HIP ARTHROPLASTY ANTERIOR APPROACH (Left)  Patient Location: PACU  Anesthesia Type: Spinal  Level of Consciousness: awake and alert   Airway and Oxygen Therapy: Patient Spontanous Breathing  Post-op Pain: mild  Post-op Assessment: Post-op Vital signs reviewed, Patient's Cardiovascular Status Stable, Respiratory Function Stable, Patent Airway and No signs of Nausea or vomiting  Last Vitals:  Filed Vitals:   11/04/13 1315  BP: 116/58  Pulse: 45  Temp: 36.5 C  Resp: 16    Post-op Vital Signs: stable   Complications: No apparent anesthesia complications

## 2013-11-04 NOTE — Evaluation (Signed)
Physical Therapy Evaluation Patient Details Name: Lance Hunt MRN: [id number] DOB: [birth year] Today's Date: 11/04/2013   History of Present Illness  Pt is a 53 year old male s/p L direct anterior THA with hx of R THA.  Clinical Impression  Pt is s/p L THA resulting in the deficits listed below (see PT Problem List).  Pt will benefit from skilled PT to increase their independence and safety with mobility to allow discharge to the venue listed below.  Pt mobilizing well POD #0 and plans to d/c home likely tomorrow.     Follow Up Recommendations Home health PT    Equipment Recommendations  None recommended by PT    Recommendations for Other Services       Precautions / Restrictions Precautions Precautions: None Restrictions Other Position/Activity Restrictions: WBAT      Mobility  Bed Mobility Overal bed mobility: Needs Assistance Bed Mobility: Supine to Sit     Supine to sit: Min guard        Transfers Overall transfer level: Needs assistance Equipment used: Rolling walker (2 wheeled) Transfers: Sit to/from Stand Sit to Stand: Min guard         General transfer comment: verbal cues for UE placement  Ambulation/Gait Ambulation/Gait assistance: Min guard Ambulation Distance (Feet): 80 Feet Assistive device: Rolling walker (2 wheeled) Gait Pattern/deviations: Antalgic;Step-through pattern     General Gait Details: verbal cues for sequence, RW positioning, neutral L LE due to tends to IR/toe in  Stairs            Wheelchair Mobility    Modified Rankin (Stroke Patients Only)       Balance                                             Pertinent Vitals/Pain Pain Assessment: No/denies pain    Home Living Family/patient expects to be discharged to:: Private residence Living Arrangements: Children   Type of Home: House Home Access: Stairs to enter Entrance Stairs-Rails: Right Entrance Stairs-Number of Steps: 2 and 1 Home  Layout: One level Home Equipment: Walker - 2 wheels      Prior Function Level of Independence: Independent               Hand Dominance        Extremity/Trunk Assessment               Lower Extremity Assessment: LLE deficits/detail   LLE Deficits / Details: functional hip weakness observed     Communication   Communication: No difficulties  Cognition Arousal/Alertness: Awake/alert Behavior During Therapy: WFL for tasks assessed/performed Overall Cognitive Status: Within Functional Limits for tasks assessed                      General Comments      Exercises        Assessment/Plan    PT Assessment Patient needs continued PT services  PT Diagnosis Difficulty walking   PT Problem List Decreased strength;Decreased mobility;Pain  PT Treatment Interventions Gait training;DME instruction;Stair training;Patient/family education;Functional mobility training;Therapeutic activities;Therapeutic exercise   PT Goals (Current goals can be found in the Care Plan section) Acute Rehab PT Goals PT Goal Formulation: With patient Time For Goal Achievement: 11/08/13 Potential to Achieve Goals: Good    Frequency 7X/week   Barriers to discharge  Co-evaluation               End of Session   Activity Tolerance: Patient tolerated treatment well Patient left: in chair;with call bell/phone within reach;with family/visitor present           Time: [id number] PT Time Calculation (min): 12 min   Charges:   PT Evaluation $Initial PT Evaluation Tier I: 1 Procedure PT Treatments $Gait Training: 8-22 mins   PT G Codes:          Erby Sanderson,KATHrine E 11/04/2013, 5:29 PM Zenovia JarredKati Marylan Glore, PT, DPT 11/04/2013 Pager: [phone]

## 2013-11-04 NOTE — Progress Notes (Signed)
[facility] the [facility]: Patient declined rw and commode - has both at home already.  If patient discharges after hours, please call [phone].   Renard HamperLecretia Hunt 11/04/2013, 3:49 PM

## 2013-11-05 LAB — CBC
HCT: 29 % — ABNORMAL LOW (ref 39.0–52.0)
HEMOGLOBIN: 10 g/dL — AB (ref 13.0–17.0)
MCH: 32.6 pg (ref 26.0–34.0)
MCHC: 34.5 g/dL (ref 30.0–36.0)
MCV: 94.5 fL (ref 78.0–100.0)
Platelets: 232 10*3/uL (ref 150–400)
RBC: 3.07 MIL/uL — ABNORMAL LOW (ref 4.22–5.81)
RDW: 12.9 % (ref 11.5–15.5)
WBC: 13.4 10*3/uL — AB (ref 4.0–10.5)

## 2013-11-05 LAB — BASIC METABOLIC PANEL
Anion gap: 10 (ref 5–15)
BUN: 15 mg/dL (ref 6–23)
CO2: 26 meq/L (ref 19–32)
Calcium: 8.5 mg/dL (ref 8.4–10.5)
Chloride: 104 mEq/L (ref 96–112)
Creatinine, Ser: 0.71 mg/dL (ref 0.50–1.35)
GFR calc non Af Amer: >90 mL/min (ref >90)
Glucose, Bld: 143 mg/dL — ABNORMAL HIGH (ref 70–99)
POTASSIUM: 4.2 meq/L (ref 3.7–5.3)
Sodium: 140 mEq/L (ref 137–147)

## 2013-11-05 MED ORDER — METHOCARBAMOL 500 MG PO TABS: 500.0000 mg | ORAL_TABLET | Freq: Four times a day (QID) | ORAL | Status: AC | PRN

## 2013-11-05 MED ORDER — HYDROCODONE-ACETAMINOPHEN 7.5-325 MG PO TABS
1.0000 | ORAL_TABLET | ORAL | Status: AC | PRN
Start: 2013-11-05 — End: ?

## 2013-11-05 MED ORDER — POLYETHYLENE GLYCOL 3350 17 G PO PACK: 17.0000 g | PACK | Freq: Two times a day (BID) | ORAL | Status: AC

## 2013-11-05 MED ORDER — ASPIRIN 325 MG PO TBEC: 325.0000 mg | DELAYED_RELEASE_TABLET | Freq: Two times a day (BID) | ORAL | Status: AC

## 2013-11-05 MED ORDER — DSS 100 MG PO CAPS: 100.0000 mg | ORAL_CAPSULE | Freq: Two times a day (BID) | ORAL | Status: AC

## 2013-11-05 MED ORDER — FERROUS SULFATE 325 (65 FE) MG PO TABS: 325.0000 mg | ORAL_TABLET | Freq: Three times a day (TID) | ORAL | Status: AC

## 2013-11-05 NOTE — Care Management Note (Signed)
    Page 1 of 1   11/05/2013     8:29:27 AM CARE MANAGEMENT NOTE 11/05/2013  Patient:  Lance, Hunt   Account Number:  [id number]  Date Initiated:  11/05/2013  Documentation initiated by:  [facility]  Subjective/Objective Assessment:   adm: LEFT TOTAL HIP ARTHROPLASTY ANTERIOR APPROACH (Left)     Action/Plan:   discharge planning   Anticipated DC Date:  11/05/2013   Anticipated DC Plan:  [place]  CM consult      Choice offered to / List presented to:          [facility] arranged  [name] Patient Refused      Status of service:   Medicare Important Message given?   (If response is "NO", the following Medicare IM given date fields will be blank) Date Medicare IM given:   Medicare IM given by:   Date Additional Medicare IM given:   Additional Medicare IM given by:    Discharge Disposition:  HOME/SELF CARE  Per UR Regulation:    If discussed at Long Length of Stay Meetings, dates discussed:    Comments:  11/05/13 08:15 CM met with pt in room.  Pt will recuperate at his mother's house in [place].  Pt states he had a previous hip and politely declines any HH services.  Pt has crutches, rolling walker, elevated seat at his mother's. CM suggested he call his MD's office if he changes his mind after he gets home for [place] arrangements.  No other CM needs were communicated.  Mariane Masters, BSN, CM [phone].

## 2013-11-05 NOTE — Progress Notes (Signed)
Utilization review completed.  

## 2013-11-05 NOTE — Progress Notes (Signed)
     Subjective: 1 Day Post-Op Procedure(s) (LRB): LEFT TOTAL HIP ARTHROPLASTY ANTERIOR APPROACH (Left)   Patient reports pain as mild, pain controlled. No events throughout the night.  Some pain, but the pain prior to surgery is gone.   Objective:   VITALS:   Filed Vitals:   11/05/13 0640  BP: 110/50  Pulse: 58  Temp: 97.9 F (36.6 C)  Resp: 16    Dorsiflexion/Plantar flexion intact Incision: dressing C/D/I No cellulitis present Compartment soft  LABS  Recent Labs  11/05/13 0440  HGB 10.0*  HCT 29.0*  WBC 13.4*  PLT 232     Recent Labs  11/05/13 0440  NA 140  K 4.2  BUN 15  CREATININE 0.71  GLUCOSE 143*     Assessment/Plan: 1 Day Post-Op Procedure(s) (LRB): LEFT TOTAL HIP ARTHROPLASTY ANTERIOR APPROACH (Left) Foley cath d/c'ed Advance diet Up with therapy D/C IV fluids Discharge home with home health [facility]. Follow up with OLIN,Cherilyn Sautter D in 2 weeks.  Contact information:  [facility] [address], [address] [place] [id number] [phone]        Anastasio AuerbachMatthew S. Deniz Eskridge   PAC  11/05/2013, 9:13 AM

## 2013-11-05 NOTE — Progress Notes (Signed)
Physical Therapy Treatment Patient Details Name: Lance LeydenSteven J Hunt MRN: [id number] DOB: 06-12-1960 Today's Date: 11/05/2013    History of Present Illness Pt is a 53 year old male s/p L direct anterior THA with hx of R THA.    PT Comments    Pt ambulating well and performed standing exercises today.  Pt given safety cues and daughter present and reports she and her siblings will assist in reminding pt.  Pt provided with HEP.  Pt had no further questions and ready for d/c home today.   Follow Up Recommendations  Home health PT     Equipment Recommendations  None recommended by PT    Recommendations for Other Services       Precautions / Restrictions Precautions Precautions: None Restrictions Other Position/Activity Restrictions: WBAT    Mobility  Bed Mobility               General bed mobility comments: pt sitting EOB on arrival and left EOB with RN upon departing room  Transfers Overall transfer level: Needs assistance Equipment used: Rolling walker (2 wheeled) Transfers: Sit to/from Stand Sit to Stand: Supervision         General transfer comment: verbal cues for UE placement and using RW for safety  Ambulation/Gait Ambulation/Gait assistance: Supervision Ambulation Distance (Feet): 400 Feet Assistive device: Rolling walker (2 wheeled) Gait Pattern/deviations: Step-through pattern     General Gait Details: verbal cues for neutral L LE (improved since yesterday however tends to allow slight toe in), pt attempting to ambulate at times without assistive device in room and requested pt use RW for a least a couple days for healing and safety   Stairs Stairs: Yes Stairs assistance: Supervision Stair Management: Alternating pattern;Forwards;Two rails   General stair comments: pt performed 2 steps up and 3 down and then 3 up and 2 down without difficulty  Wheelchair Mobility    Modified Rankin (Stroke Patients Only)       Balance                                    Cognition Arousal/Alertness: Awake/alert Behavior During Therapy: WFL for tasks assessed/performed Overall Cognitive Status: Within Functional Limits for tasks assessed                      Exercises Total Joint Exercises Hip ABduction/ADduction: AROM;Standing;Left;15 reps Long Arc Quad: AROM;Seated;Left;15 reps Knee Flexion: AROM;Seated;Left;15 reps Marching in Standing: AROM;Left;15 reps General Exercises - Lower Extremity Heel Raises: AROM;Standing;Both;15 reps Mini-Sqauts: 10 reps;AROM;Both;Other (comment) (performed without use of UE ) Other Exercises Other Exercises: pt educated to have UE support for standing exercises for safety with balance    General Comments        Pertinent Vitals/Pain Pain Assessment: No/denies pain    Home Living                      Prior Function            PT Goals (current goals can now be found in the care plan section) Progress towards PT goals: Progressing toward goals    Frequency  7X/week    PT Plan Current plan remains appropriate    Co-evaluation             End of Session   Activity Tolerance: Patient tolerated treatment well Patient left: in bed;with call bell/phone within reach;with nursing/sitter in  room;with family/visitor present     Time: 1001-1025 PT Time Calculation (min): 24 min  Charges:  $Gait Training: 8-22 mins $Therapeutic Exercise: 8-22 mins                    G Codes:      Heavenly Christine,KATHrine E 11/05/2013, 12:24 PM Zenovia JarredKati Zariah Cavendish, PT, DPT 11/05/2013 Pager: [phone]

## 2013-11-05 NOTE — Progress Notes (Signed)
OT Cancellation Note  Patient Details Name: Collene LeydenSteven J Mcclurkin MRN: [id number] DOB: [birth year]   Cancelled Treatment:    Reason Eval/Treat Not Completed: PT screened, no needs identified, will sign off  Corneilus Heggie 11/05/2013, 10:40 AM Marica OtterMaryellen Brunette Lavalle, OTR/L [phone] 11/05/2013

## 2013-11-12 NOTE — Discharge Summary (Signed)
Physician Discharge Summary  Patient ID: Collene LeydenSteven J Pavlik MRN: [id number] DOB/AGE: 53-22-1962 53 y.o.  Admit date: 11/04/2013 Discharge date: 11/05/2013   Procedures:  Procedure(s) (LRB): LEFT TOTAL HIP ARTHROPLASTY ANTERIOR APPROACH (Left)  Attending Physician:  Dr. Durene RomansMatthew Olin   Admission Diagnoses:   Left hip primary OA / pain  Discharge Diagnoses:  Principal Problem:   S/P left THA, AA  Past Medical History  Diagnosis Date  . Hypertension   . Persistent atrial fibrillation   . Dysrhythmia     AF - CARDIOLOGIST IS DR. Thre[facility] IN [place]  . OA (osteoarthritis)     PAIN AND OA LEFT HIP; S/P RIGHT TOTAL HIP REPLACEMENT; DEGENERATIVE CHANGES IN LUMBAR SPINE-SOME BACK DISCOMFORT    HPI: Tyton JEFFREY Katrinka BlazingSmith, 53 y.o. male, has a history of pain and functional disability in the left hip(s) due to arthritis and patient has failed non-surgical conservative treatments for greater than 12 weeks to include NSAID's and/or analgesics and activity modification. Onset of symptoms was gradual starting 1+ years ago with gradually worsening course since that time.The patient noted prior procedures of the hip to include arthroplasty on the right hip per Dr. Charlann Boxerlin. Patient currently rates pain in the left hip at 5 out of 10 with activity. Patient has worsening of pain with activity and weight bearing, trendelenberg gait, pain that interfers with activities of daily living and pain with passive range of motion. Patient has evidence of periarticular osteophytes and joint space narrowing by imaging studies. This condition presents safety issues increasing the risk of falls.There is no current active infection. Risks, benefits and expectations were discussed with the patient. Risks including but not limited to the risk of anesthesia, blood clots, nerve damage, blood vessel damage, failure of the prosthesis, infection and up to and including death. Patient  understand the risks, benefits and expectations and wishes to proceed with surgery.  PCP: Pcp Not In System   Discharged Condition: good  Hospital Course:  Patient underwent the above stated procedure on 11/04/2013. Patient tolerated the procedure well and brought to the recovery room in good condition and subsequently to the floor.  POD #1 BP: 110/50 ; Pulse: 58 ; Temp: 97.9 F (36.6 C) ; Resp: 16  Patient reports pain as mild, pain controlled. No events throughout the night. Some pain, but the pain prior to surgery is gone.  Dorsiflexion/plantar flexion intact, incision: dressing C/D/I, no cellulitis present and compartment soft.   LABS  Basename    HGB  10.0  HCT  29.0     Discharge Exam: General appearance: alert, cooperative and no distress Extremities: Homans sign is negative, no sign of DVT, no edema, redness or tenderness in the calves or thighs and no ulcers, gangrene or trophic changes  Disposition: Home with follow up in 2 weeks   Follow-up Information    Follow up with Shelda PalLIN,Delois Tolbert D, MD. Schedule an appointment as soon as possible for a visit in 2 weeks.   Specialty:  Orthopedic Surgery   Contact information:   [address] [place] [id number] [phone]       Discharge Instructions    Call MD / Call 911    Complete by:  As directed   If you experience chest pain or shortness of breath, CALL 911 and be transported to the hospital emergency room.  If you develope a fever above 101 F, pus (white drainage) or increased drainage or redness at the wound,  or calf pain, call your surgeon's office.     Change dressing    Complete by:  As directed   Maintain surgical dressing for 10-14 days, or until follow up in the clinic.     Constipation Prevention    Complete by:  As directed   Drink plenty of fluids.  Prune juice may be helpful.  You may use a stool softener, such as Colace (over the counter) 100 mg twice a day.  Use MiraLax (over the  counter) for constipation as needed.     Diet - low sodium heart healthy    Complete by:  As directed      Discharge instructions    Complete by:  As directed   Maintain surgical dressing for 10-14 days, or until follow up in the clinic. [facility]. Call with any questions or concerns.     Increase activity slowly as tolerated    Complete by:  As directed      TED hose    Complete by:  As directed   Use stockings (TED hose) for 2 weeks on both leg(s).  You may remove them at night for sleeping.     Weight bearing as tolerated    Complete by:  As directed   Laterality:  left  Extremity:  Lower             Medication List    STOP taking these medications        aspirin 325 MG tablet  Replaced by:  aspirin 325 MG EC tablet      TAKE these medications        amLODipine 10 MG tablet  Commonly known as:  NORVASC  Take 10 mg by mouth every morning.     aspirin 325 MG EC tablet  Take 1 tablet (325 mg total) by mouth 2 (two) times daily.     DSS 100 MG Caps  Take 100 mg by mouth 2 (two) times daily.     ferrous sulfate 325 (65 FE) MG tablet  Take 1 tablet (325 mg total) by mouth 3 (three) times daily after meals.     flecainide 100 MG tablet  Commonly known as:  TAMBOCOR  Take 100 mg by mouth 2 (two) times daily.     HYDROcodone-acetaminophen 7.5-325 MG per tablet  Commonly known as:  NORCO  Take 1-2 tablets by mouth every 4 (four) hours as needed for moderate pain.     methocarbamol 500 MG tablet  Commonly known as:  ROBAXIN  Take 1 tablet (500 mg total) by mouth every 6 (six) hours as needed for muscle spasms.     metoprolol 50 MG tablet  Commonly known as:  LOPRESSOR  Take 25 mg by mouth 2 (two) times daily.     polyethylene glycol packet  Commonly known as:  MIRALAX / GLYCOLAX  Take 17 g by mouth 2 (two) times daily.         Signed: Anastasio AuerbachMatthew S. Patryce Depriest   PA-C  11/12/2013, 12:42 PM

## 2017-02-03 ENCOUNTER — Emergency Department (HOSPITAL_COMMUNITY)
Admission: EM | Admit: 2017-02-03 | Discharge: 2017-02-03 | Disposition: A | Discharge status: Home / Self Care | Payer: BLUE CROSS/BLUE SHIELD | Attending: Physician Assistant | Admitting: Physician Assistant

## 2017-02-03 ENCOUNTER — Other Ambulatory Visit: Payer: Self-pay

## 2017-02-03 ENCOUNTER — Encounter (HOSPITAL_COMMUNITY): Payer: Self-pay | Admitting: Emergency Medicine

## 2017-02-03 ENCOUNTER — Emergency Department (HOSPITAL_COMMUNITY): Payer: BLUE CROSS/BLUE SHIELD

## 2017-02-03 DIAGNOSIS — Z79899 Other long term (current) drug therapy: Secondary | ICD-10-CM | POA: Diagnosis not present

## 2017-02-03 DIAGNOSIS — Z7982 Long term (current) use of aspirin: Secondary | ICD-10-CM | POA: Insufficient documentation

## 2017-02-03 DIAGNOSIS — I4891 Unspecified atrial fibrillation: Secondary | ICD-10-CM | POA: Diagnosis not present

## 2017-02-03 DIAGNOSIS — R079 Chest pain, unspecified: Secondary | ICD-10-CM | POA: Diagnosis present

## 2017-02-03 DIAGNOSIS — I10 Essential (primary) hypertension: Secondary | ICD-10-CM | POA: Diagnosis not present

## 2017-02-03 DIAGNOSIS — R0789 Other chest pain: Secondary | ICD-10-CM | POA: Insufficient documentation

## 2017-02-03 LAB — CBC
HCT: 43.3 % (ref 39.0–52.0)
Hemoglobin: 14.6 g/dL (ref 13.0–17.0)
MCH: 32.2 pg (ref 26.0–34.0)
MCHC: 33.7 g/dL (ref 30.0–36.0)
MCV: 95.6 fL (ref 78.0–100.0)
Platelets: 313 10*3/uL (ref 150–400)
RBC: 4.53 MIL/uL (ref 4.22–5.81)
RDW: 12.7 % (ref 11.5–15.5)
WBC: 10.6 10*3/uL — AB (ref 4.0–10.5)

## 2017-02-03 LAB — BASIC METABOLIC PANEL
Anion gap: 11 (ref 5–15)
BUN: 16 mg/dL (ref 6–20)
CO2: 24 mmol/L (ref 22–32)
Calcium: 10 mg/dL (ref 8.9–10.3)
Chloride: 103 mmol/L (ref 101–111)
Creatinine, Ser: 0.83 mg/dL (ref 0.61–1.24)
GFR calc Af Amer: >60 mL/min (ref >60)
GFR calc non Af Amer: >60 mL/min (ref >60)
Glucose, Bld: 108 mg/dL — ABNORMAL HIGH (ref 65–99)
Potassium: 4.5 mmol/L (ref 3.5–5.1)
SODIUM: 138 mmol/L (ref 135–145)

## 2017-02-03 LAB — I-STAT TROPONIN, ED
TROPONIN I, POC: 0 ng/mL (ref 0.00–0.08)
Troponin i, poc: 0 ng/mL (ref 0.00–0.08)

## 2017-02-03 NOTE — ED Provider Notes (Signed)
[facility] EMERGENCY DEPARTMENT Provider Note   CSN: [id number] Arrival date & time: 02/03/17  1829     History   Chief Complaint Chief Complaint  Patient presents with  . Chest Pain    HPI Lance Hunt is a 57 y.o. male with a history of A. fib which resolved status post ablation, who presents today for evaluation of intermittent left-sided chest pain.  He reports that he was not feeling good and checked his blood pressure and noted that it was elevated 180s.  He reports that after he saw that his blood pressure was elevated he began having stabbing intermittent chest pains that would last for a few seconds and then go away.  These were in the left side of his chest and left shoulder blade.  He reports that he has not had any of these pains for the past hour.  He took his metoprolol after his BP was noted to be elevated.  No N/V/D, no coughs or SOB.   HPI  Past Medical History:  Diagnosis Date  . Dysrhythmia    AF - CARDIOLOGIST IS DR. Threasa Beards - [facility] IN [place]  . Hypertension   . OA (osteoarthritis)    PAIN AND OA LEFT HIP; S/P RIGHT TOTAL HIP REPLACEMENT; DEGENERATIVE CHANGES IN LUMBAR SPINE-SOME BACK DISCOMFORT  . Persistent atrial fibrillation [facility])     Patient Active Problem List   Diagnosis Date Noted  . S/P left THA, AA 11/04/2013  . Congestive dilated cardiomyopathy (HCC) 01/12/2012  . Essential hypertension 01/12/2012  . Preop cardiovascular exam 05/26/2010  . Atrial fibrillation (HCC) 05/26/2010    Past Surgical History:  Procedure Laterality Date  . APPENDECTOMY     4TH GRADE  . CARDIOVERSION  01/18/2012   Procedure: CARDIOVERSION;  Surgeon: Lewayne Bunting, MD;  Location: [facility] ENDOSCOPY;  Service: Cardiovascular;  Laterality: N/A;  . CARDIOVERSION     NOV 2014  . HEART ABLATION  Feb 12, 2013   DONE IN [place], [place]  . JOINT REPLACEMENT    . Left shoulder surgery     AGE 73  - LEFT SHOULDER  . TOTAL  HIP ARTHROPLASTY  JULY 2012  . TOTAL HIP ARTHROPLASTY Left 11/04/2013   Procedure: LEFT TOTAL HIP ARTHROPLASTY ANTERIOR APPROACH;  Surgeon: Shelda Pal, MD;  Location: [facility] ORS;  Service: Orthopedics;  Laterality: Left;       Home Medications    Prior to Admission medications   Medication Sig Start Date End Date Taking? Authorizing Provider  amLODipine (NORVASC) 10 MG tablet Take 10 mg by mouth every morning.    [provider]  aspirin EC 325 MG EC tablet Take 1 tablet (325 mg total) by mouth 2 (two) times daily. 11/05/13   Lanney Gins, PA-C  docusate sodium 100 MG CAPS Take 100 mg by mouth 2 (two) times daily. 11/05/13   Lanney Gins, PA-C  ferrous sulfate 325 (65 FE) MG tablet Take 1 tablet (325 mg total) by mouth 3 (three) times daily after meals. 11/05/13   Lanney Gins, PA-C  flecainide (TAMBOCOR) 100 MG tablet Take 100 mg by mouth 2 (two) times daily.    [provider]  HYDROcodone-acetaminophen (NORCO) 7.5-325 MG per tablet Take 1-2 tablets by mouth every 4 (four) hours as needed for moderate pain. 11/05/13   Lanney Gins, PA-C  methocarbamol (ROBAXIN) 500 MG tablet Take 1 tablet (500 mg total) by mouth every 6 (six) hours as needed for muscle  spasms. 11/05/13   Lanney Gins, PA-C  metoprolol (LOPRESSOR) 50 MG tablet Take 25 mg by mouth 2 (two) times daily.    [provider]  polyethylene glycol (MIRALAX / GLYCOLAX) packet Take 17 g by mouth 2 (two) times daily. 11/05/13   Lanney Gins, PA-C    Family History Family History  Problem Relation Age of Onset  . Heart disease Mother     Social History Social History   Tobacco Use  . Smoking status: Never Smoker  . Smokeless tobacco: Never Used  Substance Use Topics  . Alcohol use: Yes    Comment: OCCAS ALCOHOL  . Drug use: No     Allergies   Patient has no known allergies.   Review of Systems Review of Systems  Constitutional: Negative for chills, diaphoresis and  fever.  Respiratory: Negative for cough and chest tightness.   Cardiovascular: Positive for chest pain (Intermittent). Negative for palpitations and leg swelling.  Gastrointestinal: Negative for nausea and vomiting.  Skin: Negative for color change.  Neurological: Negative for dizziness and headaches.  All other systems reviewed and are negative.    Physical Exam Updated Vital Signs BP (!) 180/103   Pulse (!) 54   Temp 98.5 F (36.9 C) (Oral)   Resp 12   Ht 6' (1.829 m)   Wt 84.8 kg (187 lb)   SpO2 99%   BMI 25.36 kg/m   Physical Exam  Constitutional: He is oriented to person, place, and time. He appears well-developed and well-nourished.  Non-toxic appearance.  HENT:  Head: Normocephalic and atraumatic.  Eyes: Conjunctivae are normal.  Neck: Neck supple. No JVD present.  Cardiovascular: Regular rhythm and normal pulses. Bradycardia present.  No murmur heard. Pulses:      Dorsalis pedis pulses are 2+ on the right side, and 2+ on the left side.       Posterior tibial pulses are 2+ on the right side, and 2+ on the left side.  Pulmonary/Chest: Effort normal and breath sounds normal. No tachypnea. No respiratory distress. He has no decreased breath sounds. He has no rhonchi.  Abdominal: Soft. There is no tenderness.  Musculoskeletal: Normal range of motion. He exhibits no edema.       Right lower leg: Normal. He exhibits no tenderness and no edema.       Left lower leg: Normal. He exhibits no tenderness and no edema.  Neurological: He is alert and oriented to person, place, and time.  Skin: Skin is warm and dry.  Psychiatric: He has a normal mood and affect.  Nursing note and vitals reviewed.    ED Treatments / Results  Labs (all labs ordered are listed, but only abnormal results are displayed) Labs Reviewed  BASIC METABOLIC PANEL - Abnormal; Notable for the following components:      Result Value   Glucose, Bld 108 (*)    All other components within normal limits    CBC - Abnormal; Notable for the following components:   WBC 10.6 (*)    All other components within normal limits  I-STAT TROPONIN, ED  I-STAT TROPONIN, ED    EKG  EKG Interpretation  Date/Time:  Saturday February 03 2017 18:35:12 EST Ventricular Rate:  51 PR Interval:  168 QRS Duration: 100 QT Interval:  426 QTC Calculation: 392 R Axis:   58 Text Interpretation:  Sinus bradycardia Otherwise normal ECG No significant change since last tracing Confirmed by Corlis Leak, Courteney ([id number]) on 02/03/2017 10:53:42 PM  Radiology Dg Chest 2 View  Result Date: 02/03/2017 CLINICAL DATA:  Left-sided chest pain, shortness of breath, and dizziness beginning this morning. EXAM: CHEST  2 VIEW COMPARISON:  10/31/2013 FINDINGS: The heart size and mediastinal contours are within normal limits. Both lungs are clear. No evidence of pneumothorax or pleural effusion. IMPRESSION: No active cardiopulmonary disease. Electronically Signed   By: Myles RosenthalJohn  Stahl M.D.   On: 02/03/2017 19:21    Procedures Procedures (including critical care time)  Medications Ordered in ED Medications - No data to display   Initial Impression / Assessment and Plan / ED Course  I have reviewed the triage vital signs and the nursing notes.  Pertinent labs & imaging results that were available during my care of the patient were reviewed by me and considered in my medical decision making (see chart for details).  Clinical Course as of Feb 03 2317  Sat Feb 03, 2017  2313 When went to discuss results with patient his BP was approx 125/80.  [EH]    Clinical Course User Index [EH] Cristina GongHammond, Elizabeth W, PA-C   Patient is to be discharged with recommendation to follow up with PCP in regards to today's hospital visit. Chest pain is not likely of cardiac or pulmonary etiology d/t presentation,  VSS, no tracheal deviation, no JVD or new murmur, RRR, breath sounds equal bilaterally, EKG without acute abnormalities, negative  troponin, and negative CXR. PE considered, low suspicion based on intermittent nature of pain, not tachycardic, no SOB.   Chart review shows patient is normally bradycardic.  BP improved when patient was allowed to sit in room with out staff present.  Pt has been advised to return to the ED if CP returns,is  associated with diaphoresis or nausea, radiates to left jaw/arm, worsens or becomes concerning in any way. Pt appears reliable for follow up and is agreeable to discharge.   Case has been discussed with and seen by Dr. Corlis LeakMacKuen who agrees with the above plan to discharge.    Final Clinical Impressions(s) / ED Diagnoses   Final diagnoses:  Atypical chest pain    ED Discharge Orders    None       Norman ClayHammond, Elizabeth W, PA-C 02/03/17 2320    Abelino DerrickMackuen, Courteney Lyn, MD 02/04/17 [phone]

## 2017-02-03 NOTE — ED Notes (Signed)
ED Provider at bedside. 

## 2017-02-03 NOTE — Discharge Instructions (Signed)
Please follow-up with your cardiologist and primary care provider.    

## 2017-02-03 NOTE — ED Triage Notes (Signed)
Pt c/o mid chest pain onset this am.  Pt st's pain started after checking his blood pressure which was high.  Pt st';s pain radiates around under left shoulder blade

## 2022-11-15 ENCOUNTER — Encounter (HOSPITAL_COMMUNITY): Payer: Self-pay | Admitting: Emergency Medicine
